# Patient Record
Sex: Male | Born: 1935 | Race: White | Hispanic: No | Marital: Married | Smoking: Former smoker
Health system: Southern US, Community
[De-identification: ages and names within clinical notes are randomized; demographics above are authoritative.]

## PROBLEM LIST (undated history)

## (undated) DIAGNOSIS — N189 Chronic kidney disease, unspecified: Secondary | ICD-10-CM

## (undated) DIAGNOSIS — H25019 Cortical age-related cataract, unspecified eye: Secondary | ICD-10-CM

## (undated) DIAGNOSIS — M5431 Sciatica, right side: Secondary | ICD-10-CM

## (undated) DIAGNOSIS — K635 Polyp of colon: Secondary | ICD-10-CM

## (undated) DIAGNOSIS — D126 Benign neoplasm of colon, unspecified: Secondary | ICD-10-CM

## (undated) DIAGNOSIS — M48061 Spinal stenosis, lumbar region without neurogenic claudication: Secondary | ICD-10-CM

## (undated) DIAGNOSIS — G629 Polyneuropathy, unspecified: Secondary | ICD-10-CM

## (undated) DIAGNOSIS — M1990 Unspecified osteoarthritis, unspecified site: Secondary | ICD-10-CM

## (undated) DIAGNOSIS — E7800 Pure hypercholesterolemia, unspecified: Secondary | ICD-10-CM

## (undated) DIAGNOSIS — I4891 Unspecified atrial fibrillation: Secondary | ICD-10-CM

---

## 2014-05-26 DIAGNOSIS — M1990 Unspecified osteoarthritis, unspecified site: Secondary | ICD-10-CM | POA: Insufficient documentation

## 2014-05-26 DIAGNOSIS — M810 Age-related osteoporosis without current pathological fracture: Secondary | ICD-10-CM | POA: Insufficient documentation

## 2014-05-26 DIAGNOSIS — E785 Hyperlipidemia, unspecified: Secondary | ICD-10-CM | POA: Insufficient documentation

## 2014-05-26 DIAGNOSIS — N183 Chronic kidney disease, stage 3 unspecified: Secondary | ICD-10-CM | POA: Insufficient documentation

## 2014-06-13 DIAGNOSIS — N529 Male erectile dysfunction, unspecified: Secondary | ICD-10-CM | POA: Insufficient documentation

## 2015-11-02 HISTORY — PX: UPPER GASTROINTESTINAL ENDOSCOPY: SHX188

## 2016-02-27 ENCOUNTER — Encounter
Admission: RE | Disposition: A | Discharge status: Home / Self Care | Payer: Self-pay | Source: Ambulatory Visit | Attending: Gastroenterology

## 2016-02-27 ENCOUNTER — Ambulatory Visit: Payer: Medicare Other | Admitting: Certified Registered Nurse Anesthetist

## 2016-02-27 ENCOUNTER — Ambulatory Visit
Admission: RE | Admit: 2016-02-27 | Discharge: 2016-02-27 | Disposition: A | Discharge status: Home / Self Care | Payer: Medicare Other | Source: Ambulatory Visit | Attending: Gastroenterology | Admitting: Gastroenterology

## 2016-02-27 ENCOUNTER — Encounter: Payer: Self-pay | Admitting: Anesthesiology

## 2016-02-27 DIAGNOSIS — Z79899 Other long term (current) drug therapy: Secondary | ICD-10-CM | POA: Insufficient documentation

## 2016-02-27 DIAGNOSIS — K5730 Diverticulosis of large intestine without perforation or abscess without bleeding: Secondary | ICD-10-CM | POA: Diagnosis not present

## 2016-02-27 DIAGNOSIS — D123 Benign neoplasm of transverse colon: Secondary | ICD-10-CM | POA: Diagnosis not present

## 2016-02-27 DIAGNOSIS — D120 Benign neoplasm of cecum: Secondary | ICD-10-CM | POA: Diagnosis not present

## 2016-02-27 DIAGNOSIS — Z86010 Personal history of colonic polyps: Secondary | ICD-10-CM | POA: Insufficient documentation

## 2016-02-27 DIAGNOSIS — K219 Gastro-esophageal reflux disease without esophagitis: Secondary | ICD-10-CM | POA: Insufficient documentation

## 2016-02-27 DIAGNOSIS — K635 Polyp of colon: Secondary | ICD-10-CM | POA: Diagnosis not present

## 2016-02-27 DIAGNOSIS — Z1211 Encounter for screening for malignant neoplasm of colon: Secondary | ICD-10-CM | POA: Insufficient documentation

## 2016-02-27 DIAGNOSIS — K640 First degree hemorrhoids: Secondary | ICD-10-CM | POA: Diagnosis not present

## 2016-02-27 DIAGNOSIS — K644 Residual hemorrhoidal skin tags: Secondary | ICD-10-CM | POA: Diagnosis not present

## 2016-02-27 HISTORY — PX: COLONOSCOPY WITH PROPOFOL: SHX5780

## 2016-02-27 SURGERY — COLONOSCOPY WITH PROPOFOL
Anesthesia: General

## 2016-02-27 MED ORDER — SODIUM CHLORIDE 0.9 % IV SOLN
INTRAVENOUS | Status: DC
  Administered 2016-02-27: 1000 mL via INTRAVENOUS

## 2016-02-27 MED ORDER — LIDOCAINE HCL (PF) 1 % IJ SOLN
2.0000 mL | Freq: Once | INTRAMUSCULAR | Status: AC
  Administered 2016-02-27: 0.03 mL via INTRADERMAL

## 2016-02-27 MED ORDER — PROPOFOL 10 MG/ML IV BOLUS
INTRAVENOUS | Status: DC | PRN
  Administered 2016-02-27: 50 mg via INTRAVENOUS

## 2016-02-27 MED ORDER — LIDOCAINE HCL (CARDIAC) 20 MG/ML IV SOLN
INTRAVENOUS | Status: DC | PRN
  Administered 2016-02-27: 60 mg via INTRAVENOUS

## 2016-02-27 MED ORDER — PROPOFOL 500 MG/50ML IV EMUL
INTRAVENOUS | Status: DC | PRN
  Administered 2016-02-27: 140 ug/kg/min via INTRAVENOUS

## 2016-02-27 MED ORDER — LIDOCAINE HCL (PF) 1 % IJ SOLN
INTRAMUSCULAR | Status: AC
  Administered 2016-02-27: 0.03 mL via INTRADERMAL
  Filled 2016-02-27: qty 2

## 2016-02-27 NOTE — Transfer of Care (Signed)
Immediate Anesthesia Transfer of Care Note  Patient: Gerald Hunter  Procedure(s) Performed: Procedure(s): COLONOSCOPY WITH PROPOFOL (N/A)  Patient Location: PACU  Anesthesia Type:General  Level of Consciousness: sedated  Airway & Oxygen Therapy: Patient Spontanous Breathing and Patient connected to nasal cannula oxygen  Post-op Assessment: Report given to RN and Post -op Vital signs reviewed and stable  Post vital signs: Reviewed and stable  Last Vitals:  Filed Vitals:   02/27/16 1311  BP: 137/93  Pulse: 104  Temp: 36.1 C  Resp: 13    Last Pain: There were no vitals filed for this visit.       Complications: No apparent anesthesia complications

## 2016-02-27 NOTE — Discharge Instructions (Signed)

## 2016-02-27 NOTE — Anesthesia Procedure Notes (Signed)
Performed by: Lizzy Hamre Pre-anesthesia Checklist: Patient identified, Emergency Drugs available, Suction available, Patient being monitored and Timeout performed Patient Re-evaluated:Patient Re-evaluated prior to inductionOxygen Delivery Method: Nasal cannula Intubation Type: IV induction       

## 2016-02-27 NOTE — Anesthesia Postprocedure Evaluation (Signed)
Anesthesia Post Note  Patient: Gerald Hunter  Procedure(s) Performed: Procedure(s) (LRB): COLONOSCOPY WITH PROPOFOL (N/A)  Patient location during evaluation: Endoscopy Anesthesia Type: General Level of consciousness: awake and alert Pain management: pain level controlled Vital Signs Assessment: post-procedure vital signs reviewed and stable Respiratory status: spontaneous breathing, nonlabored ventilation, respiratory function stable and patient connected to nasal cannula oxygen Cardiovascular status: blood pressure returned to baseline and stable Postop Assessment: no signs of nausea or vomiting Anesthetic complications: no    Last Vitals:  Filed Vitals:   02/27/16 1450 02/27/16 1500  BP: 91/73 114/80  Pulse: 84 77  Temp:    Resp: 19 13    Last Pain: There were no vitals filed for this visit.               Martha Clan

## 2016-02-27 NOTE — Op Note (Signed)
[facility] Patient Name: Gerald Hunter Procedure Date: 02/27/2016 1:52 PM MRN: [id number] Account #: [id number] Date of Birth: Aug 17, 1934 Admit Type: Outpatient Age: 82 Room: [facility] ENDO ROOM 2 Gender: Male Note Status: Finalized Procedure:            Colonoscopy Indications:          Surveillance: Personal history of adenomatous polyps on                        last colonoscopy > 5 years ago Patient Profile:      This is a 82 year old male. Providers:            Gerrit Heck. Rayann Heman, MD Referring MD:         Ocie Cornfield. Ouida Sills, MD (Referring MD) Medicines:            Propofol per Anesthesia Complications:        No immediate complications. Procedure:            Pre-Anesthesia Assessment:                       - Prior to the procedure, a History and Physical was                        performed, and patient medications, allergies and                        sensitivities were reviewed. The patient's tolerance of                        previous anesthesia was reviewed.                       After obtaining informed consent, the colonoscope was                        passed under direct vision. Throughout the procedure,                        the patient's blood pressure, pulse, and oxygen                        saturations were monitored continuously. The                        Colonoscope was introduced through the anus and                        advanced to the the cecum, identified by appendiceal                        orifice and ileocecal valve. The colonoscopy was                        performed without difficulty. The patient tolerated the                        procedure well. The quality of the bowel preparation                        was good. Findings:  The perianal exam findings include non-thrombosed external hemorrhoids.      A 15 mm polyp was found in the cecum. The polyp was flat. The polyp was       removed with a saline  injection-lift technique using a hot snare.       Resection and retrieval were complete. To prevent bleeding after the       polypectomy, two hemostatic clips were successfully placed (MR       conditional). There was no bleeding at the end of the procedure.      Four sessile polyps were found in the transverse colon. The polyps were       3 to 6 mm in size. These polyps were removed with a cold snare.       Resection and retrieval were complete.      Many small and large-mouthed diverticula were found in the sigmoid colon.      Internal hemorrhoids were found during retroflexion. The hemorrhoids       were Grade I (internal hemorrhoids that do not prolapse).      The exam was otherwise without abnormality. Impression:           - Non-thrombosed external hemorrhoids found on perianal                        exam.                       - One 15 mm polyp in the cecum, removed using                        injection-lift and a hot snare. Resected and retrieved.                        Clips (MR conditional) were placed.                       - Four 3 to 6 mm polyps in the transverse colon,                        removed with a cold snare. Resected and retrieved.                       - Diverticulosis in the sigmoid colon.                       - Internal hemorrhoids.                       - The examination was otherwise normal. Recommendation:       - Observe patient in GI recovery unit.                       - High fiber diet.                       - Continue present medications.                       - Await pathology results.                       - Return to referring physician.                       -  Would no perfrom further colon cncer screening based                        on age.                       - The findings and recommendations were discussed with                        the patient.                       - The findings and recommendations were discussed with                         the patient's family. Procedure Code(s):    --- Professional ---                       [phone], Colonoscopy, flexible; with removal of tumor(s),                        polyp(s), or other lesion(s) by snare technique                       [id number], Colonoscopy, flexible; with directed submucosal                        injection(s), any substance Diagnosis Code(s):    --- Professional ---                       Z86.010, Personal history of colonic polyps                       D12.0, Benign neoplasm of cecum                       D12.3, Benign neoplasm of transverse colon (hepatic                        flexure or splenic flexure)                       K64.0, First degree hemorrhoids                       K64.4, Residual hemorrhoidal skin tags                       K57.30, Diverticulosis of large intestine without                        perforation or abscess without bleeding CPT copyright 2016 [employer]. All rights reserved. The codes documented in this report are preliminary and upon coder review may  be revised to meet current compliance requirements. Mellody Life, MD 02/27/2016 2:28:10 PM This report has been signed electronically. Number of Addenda: 0 Note Initiated On: 02/27/2016 1:52 PM Scope Withdrawal Time: 0 hours 19 minutes 1 second  Total Procedure Duration: 0 hours 27 minutes 18 seconds       [facility]

## 2016-02-27 NOTE — Anesthesia Preprocedure Evaluation (Signed)
Anesthesia Evaluation  Patient identified by MRN, date of birth, ID band Patient awake    Reviewed: Allergy & Precautions, H&P , NPO status , Patient's Chart, lab work & pertinent test results, reviewed documented beta blocker date and time   History of Anesthesia Complications Negative for: history of anesthetic complications  Airway Mallampati: II  TM Distance: >3 FB Neck ROM: full    Dental no notable dental hx. (+) Caps, Missing   Pulmonary neg pulmonary ROS,    Pulmonary exam normal breath sounds clear to auscultation       Cardiovascular Exercise Tolerance: Good negative cardio ROS Normal cardiovascular exam Rhythm:regular Rate:Normal     Neuro/Psych negative neurological ROS  negative psych ROS   GI/Hepatic Neg liver ROS, GERD  ,  Endo/Other  negative endocrine ROS  Renal/GU negative Renal ROS  negative genitourinary   Musculoskeletal   Abdominal   Peds  Hematology negative hematology ROS (+)   Anesthesia Other Findings History reviewed. No pertinent past medical history.   Reproductive/Obstetrics negative OB ROS                             Anesthesia Physical Anesthesia Plan  ASA: II  Anesthesia Plan: General   Post-op Pain Management:    Induction:   Airway Management Planned:   Additional Equipment:   Intra-op Plan:   Post-operative Plan:   Informed Consent: I have reviewed the patients History and Physical, chart, labs and discussed the procedure including the risks, benefits and alternatives for the proposed anesthesia with the patient or authorized representative who has indicated his/her understanding and acceptance.   Dental Advisory Given  Plan Discussed with: Anesthesiologist, CRNA and Surgeon  Anesthesia Plan Comments:         Anesthesia Quick Evaluation

## 2016-02-27 NOTE — H&P (Signed)
  Primary Care Physician:  Kirk Ruths., MD  Pre-Procedure History & Physical: HPI:  Gerald Hunter is a 82 y.o. male is here for an colonoscopy.   History reviewed. No pertinent past medical history.  History reviewed. No pertinent past surgical history.  Prior to Admission medications   Medication Sig Start Date End Date Taking? Authorizing Provider  atorvastatin (LIPITOR) 10 MG tablet Take 10 mg by mouth daily.   Yes Historical Provider, MD  sildenafil (REVATIO) 20 MG tablet Take 20 mg by mouth daily as needed.   Yes Historical Provider, MD    Allergies as of 02/18/2016  . (Not on File)    History reviewed. No pertinent family history.  Social History   Social History  . Marital Status: Married    Spouse Name: N/A  . Number of Children: N/A  . Years of Education: N/A   Occupational History  . Not on file.   Social History Main Topics  . Smoking status: Not on file  . Smokeless tobacco: Not on file  . Alcohol Use: Not on file  . Drug Use: Not on file  . Sexual Activity: Not on file   Other Topics Concern  . Not on file   Social History Narrative  . No narrative on file     Physical Exam: BP 137/93 mmHg  Pulse 104  Temp(Src) 97 F (36.1 C) (Tympanic)  Resp 13  Ht 5' 7.5" (1.715 m)  Wt 83.915 kg (185 lb)  BMI 28.53 kg/m2  SpO2 99% General:   Alert,  pleasant and cooperative in NAD Head:  Normocephalic and atraumatic. Neck:  Supple; no masses or thyromegaly. Lungs:  Clear throughout to auscultation.    Heart:  Regular rate and rhythm. Abdomen:  Soft, nontender and nondistended. Normal bowel sounds, without guarding, and without rebound.   Neurologic:  Alert and  oriented x4;  grossly normal neurologically.  Impression/Plan: Gerald Hunter is here for an colonoscopy to be performed for pers Hx TA  Risks, benefits, limitations, and alternatives regarding  colonoscopy have been reviewed with the patient.  Questions have been answered.  All parties  agreeable.   Josefine Class, MD  02/27/2016, 1:45 PM

## 2016-03-01 ENCOUNTER — Encounter: Payer: Self-pay | Admitting: Gastroenterology

## 2016-03-02 LAB — SURGICAL PATHOLOGY

## 2018-12-13 ENCOUNTER — Emergency Department
Admission: EM | Admit: 2018-12-13 | Discharge: 2018-12-13 | Disposition: A | Discharge status: Home / Self Care | Payer: Medicare Other | Attending: Emergency Medicine | Admitting: Emergency Medicine

## 2018-12-13 ENCOUNTER — Emergency Department: Payer: Medicare Other

## 2018-12-13 ENCOUNTER — Encounter: Payer: Self-pay | Admitting: Emergency Medicine

## 2018-12-13 ENCOUNTER — Other Ambulatory Visit: Payer: Self-pay

## 2018-12-13 DIAGNOSIS — R079 Chest pain, unspecified: Secondary | ICD-10-CM | POA: Diagnosis present

## 2018-12-13 DIAGNOSIS — K8020 Calculus of gallbladder without cholecystitis without obstruction: Secondary | ICD-10-CM | POA: Insufficient documentation

## 2018-12-13 DIAGNOSIS — Z79899 Other long term (current) drug therapy: Secondary | ICD-10-CM | POA: Insufficient documentation

## 2018-12-13 HISTORY — DX: Pure hypercholesterolemia, unspecified: E78.00

## 2018-12-13 LAB — COMPREHENSIVE METABOLIC PANEL
ALBUMIN: 4.5 g/dL (ref 3.5–5.0)
ALK PHOS: 68 U/L (ref 38–126)
ALT: 39 U/L (ref 0–44)
AST: 66 U/L — AB (ref 15–41)
Anion gap: 8 (ref 5–15)
BILIRUBIN TOTAL: 1.3 mg/dL — AB (ref 0.3–1.2)
BUN: 23 mg/dL (ref 8–23)
CALCIUM: 9 mg/dL (ref 8.9–10.3)
CO2: 28 mmol/L (ref 22–32)
Chloride: 105 mmol/L (ref 98–111)
Creatinine, Ser: 1.29 mg/dL — ABNORMAL HIGH (ref 0.61–1.24)
GFR calc Af Amer: 59 mL/min — ABNORMAL LOW (ref >60)
GFR, EST NON AFRICAN AMERICAN: 51 mL/min — AB (ref >60)
GLUCOSE: 113 mg/dL — AB (ref 70–99)
Potassium: 3.4 mmol/L — ABNORMAL LOW (ref 3.5–5.1)
Sodium: 141 mmol/L (ref 135–145)
TOTAL PROTEIN: 7.2 g/dL (ref 6.5–8.1)

## 2018-12-13 LAB — CBC WITH DIFFERENTIAL/PLATELET
Abs Immature Granulocytes: 0.02 10*3/uL (ref 0.00–0.07)
BASOS ABS: 0.1 10*3/uL (ref 0.0–0.1)
Basophils Relative: 1 %
EOS ABS: 0.3 10*3/uL (ref 0.0–0.5)
EOS PCT: 4 %
HEMATOCRIT: 48.1 % (ref 39.0–52.0)
HEMOGLOBIN: 16.1 g/dL (ref 13.0–17.0)
IMMATURE GRANULOCYTES: 0 %
LYMPHS ABS: 2.8 10*3/uL (ref 0.7–4.0)
LYMPHS PCT: 33 %
MCH: 30.9 pg (ref 26.0–34.0)
MCHC: 33.5 g/dL (ref 30.0–36.0)
MCV: 92.3 fL (ref 80.0–100.0)
MONOS PCT: 9 %
Monocytes Absolute: 0.7 10*3/uL (ref 0.1–1.0)
NRBC: 0 % (ref 0.0–0.2)
Neutro Abs: 4.5 10*3/uL (ref 1.7–7.7)
Neutrophils Relative %: 53 %
Platelets: 228 10*3/uL (ref 150–400)
RBC: 5.21 MIL/uL (ref 4.22–5.81)
RDW: 13.3 % (ref 11.5–15.5)
WBC: 8.4 10*3/uL (ref 4.0–10.5)

## 2018-12-13 LAB — TROPONIN I: Troponin I: <0.03 ng/mL (ref <0.03)

## 2018-12-13 LAB — LIPASE, BLOOD: Lipase: 35 U/L (ref 11–51)

## 2018-12-13 MED ORDER — FAMOTIDINE IN NACL 20-0.9 MG/50ML-% IV SOLN
20.0000 mg | Freq: Once | INTRAVENOUS | Status: AC
  Administered 2018-12-13: 20 mg via INTRAVENOUS
  Filled 2018-12-13: qty 50

## 2018-12-13 MED ORDER — FENTANYL CITRATE (PF) 100 MCG/2ML IJ SOLN
50.0000 ug | Freq: Once | INTRAMUSCULAR | Status: AC
  Administered 2018-12-13: 50 ug via INTRAVENOUS
  Filled 2018-12-13: qty 2

## 2018-12-13 NOTE — ED Notes (Signed)
Unable to obtain discharge signature. Electronic signature pad not working at this time.

## 2018-12-13 NOTE — ED Notes (Signed)
Patient c/o having acid reflux and requested medication. Dr. Beather Arbour aware, Pepcid given as ordered.

## 2018-12-13 NOTE — ED Provider Notes (Signed)
[facility] Emergency Department Provider Note   ____________________________________________   First MD Initiated Contact with Patient 12/13/18 0502     (approximate)  I have reviewed the triage vital signs and the nursing notes.   HISTORY  Chief Complaint Chest Pain    HPI Gerald Hunter is a 85 y.o. male who presents to the ED from home with a chief complaint of chest pain.  Patient reports awakening at 1:30 AM with substernal chest/epigastric pain which he describes as a dull ache.  Denies associated diaphoresis, nausea/vomiting, palpitations or dizziness.  At the maximum pain was 4/10 at home.  Patient took 3 full-strength aspirins prior to arrival.  Also took a reflux pill while in the ED lobby.  Pain is currently resolved.  Tells me he takes multiple aspirins daily.  Denies black or bloody stools.  Denies recent fever, chills, shortness of breath, abdominal pain, dysuria or diarrhea.  Denies recent travel or trauma.    Past Medical History:  Diagnosis Date  . Hypercholesteremia     There are no active problems to display for this patient.   Past Surgical History:  Procedure Laterality Date  . COLONOSCOPY WITH PROPOFOL N/A 02/27/2016   Procedure: COLONOSCOPY WITH PROPOFOL;  Surgeon: Josefine Class, MD;  Location: [facility] ENDOSCOPY;  Service: Endoscopy;  Laterality: N/A;    Prior to Admission medications   Medication Sig Start Date End Date Taking? Authorizing Provider  atorvastatin (LIPITOR) 10 MG tablet Take 10 mg by mouth daily.    [provider]  sildenafil (REVATIO) 20 MG tablet Take 20 mg by mouth daily as needed.    [provider]    Allergies Patient has no known allergies.  No family history on file.  Social History Social History   Tobacco Use  . Smoking status: Never Smoker  . Smokeless tobacco: Never Used  Substance Use Topics  . Alcohol use: Not on file  . Drug use: Not on file    Review of  Systems  Constitutional: No fever/chills Eyes: No visual changes. ENT: No sore throat. Cardiovascular: Positive for chest pain. Respiratory: Denies shortness of breath. Gastrointestinal: No abdominal pain.  No nausea, no vomiting.  No diarrhea.  No constipation. Genitourinary: Negative for dysuria. Musculoskeletal: Negative for back pain. Skin: Negative for rash. Neurological: Negative for headaches, focal weakness or numbness.   ____________________________________________   PHYSICAL EXAM:  VITAL SIGNS: ED Triage Vitals  Enc Vitals Group     BP 12/13/18 0222 (!) 158/87     Pulse Rate 12/13/18 0222 75     Resp 12/13/18 0222 18     Temp 12/13/18 0222 97.9 F (36.6 C)     Temp Source 12/13/18 0222 Oral     SpO2 12/13/18 0222 97 %     Weight 12/13/18 0218 190 lb (86.2 kg)     Height 12/13/18 0218 5\' 7"  (1.702 m)     Head Circumference --      Peak Flow --      Pain Score 12/13/18 0218 4     Pain Loc --      Pain Edu? --      Excl. in [place]? --     Constitutional: Alert and oriented. Well appearing and in no acute distress. Eyes: Conjunctivae are normal. PERRL. EOMI. Head: Atraumatic. Nose: No congestion/rhinnorhea. Mouth/Throat: Mucous membranes are moist.  Oropharynx non-erythematous. Neck: No stridor.   Cardiovascular: Normal rate, regular rhythm. Grossly normal heart sounds.  Good peripheral circulation. Respiratory:  Normal respiratory effort.  No retractions. Lungs CTAB. Gastrointestinal: Soft and nontender to light or deep palpation. No distention. No abdominal bruits. No CVA tenderness. Musculoskeletal: No lower extremity tenderness nor edema.  No joint effusions. Neurologic:  Normal speech and language. No gross focal neurologic deficits are appreciated. No gait instability. Skin:  Skin is warm, dry and intact. No rash noted. Psychiatric: Mood and affect are normal. Speech and behavior are normal.  ____________________________________________   LABS (all labs  ordered are listed, but only abnormal results are displayed)  Labs Reviewed  COMPREHENSIVE METABOLIC PANEL - Abnormal; Notable for the following components:      Result Value   Potassium 3.4 (*)    Glucose, Bld 113 (*)    Creatinine, Ser 1.29 (*)    AST 66 (*)    Total Bilirubin 1.3 (*)    GFR calc non Af Amer 51 (*)    GFR calc Af Amer 59 (*)    All other components within normal limits  CBC WITH DIFFERENTIAL/PLATELET  TROPONIN I  LIPASE, BLOOD  TROPONIN I   ____________________________________________  EKG  ED ECG REPORT I, Rondal Vandevelde J, the attending physician, personally viewed and interpreted this ECG.   Date: 12/13/2018  EKG Time: 0218  Rate: 73  Rhythm: normal EKG, normal sinus rhythm  Axis: Normal  Intervals:none  ST&T Change: Nonspecific  ____________________________________________  RADIOLOGY  ED MD interpretation: No acute cardiopulmonary process  Official radiology report(s): Dg Chest 2 View  Result Date: 12/13/2018 CLINICAL DATA:  Chest pain EXAM: CHEST - 2 VIEW COMPARISON:  None. FINDINGS: The heart size and mediastinal contours are within normal limits. Both lungs are clear. The visualized skeletal structures are unremarkable. IMPRESSION: No active cardiopulmonary disease. Electronically Signed   By: Inez Catalina M.D.   On: 12/13/2018 02:37    ____________________________________________   PROCEDURES  Procedure(s) performed: None  Procedures  Critical Care performed: No  ____________________________________________   INITIAL IMPRESSION / ASSESSMENT AND PLAN / ED COURSE  As part of my medical decision making, I reviewed the following data within the electronic MEDICAL RECORD NUMBER History obtained from family, Nursing notes reviewed and incorporated, Labs reviewed, EKG interpreted, Old chart reviewed, Radiograph reviewed and Notes from prior ED visits    85 year old male who presents with substernal chest pain. Differential diagnosis includes,  but is not limited to, ACS, aortic dissection, pulmonary embolism, cardiac tamponade, pneumothorax, pneumonia, pericarditis, myocarditis, GI-related causes including esophagitis/gastritis, and musculoskeletal chest wall pain.    Initial troponin and EKG are unremarkable.  Will check lipase, repeat timed troponin.  Administer IV Pepcid for discomfort.  I discussed with patient and offered admission for chest pain rule out.  He is unconvinced and desires to go home.  We agree with repeat timed troponin and checking lipase.   Clinical Course as of Dec 13 657  Wed Dec 13, 2018  0607 No relief with IV Pepcid.  Still complaining of 2/10 substernal chest/epigastric discomfort/aching.  Updated patient and spouse of repeat negative troponin and normal lipase.  Patient is still not interested in coming into the hospital.  Will send for right upper quadrant abdominal ultrasound.  If that is negative, anticipate discharge home with cardiology follow-up.   [JS]  [id number] Patient currently in ultrasound.  Spoke with spouse; if ultrasound is negative, and patient still desires discharge home, he may be discharged home with cardiology follow-up.  At this time care is transferred to Dr. Quentin Cornwall at shift change.   [JS]  Clinical Course User Index [JS] Paulette Blanch, MD     ____________________________________________   FINAL CLINICAL IMPRESSION(S) / ED DIAGNOSES  Final diagnoses:  Nonspecific chest pain     ED Discharge Orders    None       Note:  This document was prepared using Dragon voice recognition software and may include unintentional dictation errors.   Paulette Blanch, MD 12/13/18 [phone]

## 2018-12-13 NOTE — ED Triage Notes (Signed)
Pt to triage via w/c with no distress noted; pt reports upper CP, nonradiating since 130am; denies any accomp symptoms; denies hx of same

## 2018-12-13 NOTE — ED Notes (Signed)
Patient denies any cardiac history. Reports being woken up at approx 0130 by chest pain to both sides of chest "just below nipple line" with a few minutes of diaphoresis. Patient states it has been many years since last stress test, but that he was told his heart was in very good shape at previous check up. Patient denies any history of symptoms previously that are similar to current symptoms. Patient reports taking 324 ASA x3 tablets at home prior to arrival.

## 2018-12-13 NOTE — ED Notes (Signed)
Patient transported to Ultrasound 

## 2018-12-13 NOTE — Discharge Instructions (Addendum)

## 2018-12-13 NOTE — ED Notes (Signed)
Unable to reassess pain after medication administration at this time d/t patient still being in ultrasound.

## 2018-12-13 NOTE — ED Provider Notes (Signed)
Patient received in signout from Dr. Beather Arbour.  Ultrasound shows evidence of cholelithiasis we discussed results of the ultrasound including noted renal cyst and liver abnormalities.  Blood work is otherwise reassuring. there is no evidence of significant biliary obstruction by labs and no white count to suggest infectious process.  His pain is completely gone at this time.  His abdominal exam is benign.  Patient requesting discharge home.  Will be given referral for outpatient surgery as well.   Merlyn Lot, MD 12/13/18 [phone]

## 2018-12-26 ENCOUNTER — Ambulatory Visit (INDEPENDENT_AMBULATORY_CARE_PROVIDER_SITE_OTHER): Payer: Medicare Other | Admitting: Surgery

## 2018-12-26 ENCOUNTER — Encounter: Payer: Self-pay | Admitting: Surgery

## 2018-12-26 ENCOUNTER — Other Ambulatory Visit: Payer: Self-pay

## 2018-12-26 VITALS — BP 132/74 | HR 72 | Temp 97.9°F | Ht 67.0 in | Wt 194.0 lb

## 2018-12-26 DIAGNOSIS — K219 Gastro-esophageal reflux disease without esophagitis: Secondary | ICD-10-CM

## 2018-12-26 DIAGNOSIS — Z7982 Long term (current) use of aspirin: Secondary | ICD-10-CM

## 2018-12-26 DIAGNOSIS — K8020 Calculus of gallbladder without cholecystitis without obstruction: Secondary | ICD-10-CM | POA: Diagnosis not present

## 2018-12-26 NOTE — Patient Instructions (Signed)
Patient to return as needed. The patient is aware to call back for any questions or concerns. 

## 2018-12-26 NOTE — Progress Notes (Signed)
Surgical Clinic History and Physical  Referring provider:  Kirk Ruths, MD [place], [place] [address]  HISTORY OF PRESENT ILLNESS (HPI):  85 y.o. male presents for evaluation of his recent severe RUQ and epigastric abdominal pain. Patient reports his pain appears to have started after he ate a more fatty than usual chicken microwave dinner and a drink of scotch liquor, though his wife states he also frequently likes eating steak. He says that his pain was not relieved with the GI cocktail administered in the ED, but states only Zantac (recalled and no longer available), taken once - twice per week as needed, has ever provided relief for his GERD. Pepcid specifically has not helped. His pain in the ED resolved after given narcotic pain medication. He takes full-strength aspirin 325 mg x 4 tablets daily x many years for chronic back pain and recently bought a "small bottle" of "generic Nexium" (says not Prilosec/omeprazole) to try. He otherwise denies dysphasia, frequent belching, any persistent abdominal pain, blood with BM's, melena, N/V, fever/chills, unintentional weight loss, CP, or SOB.  PAST MEDICAL HISTORY (PMH):  Past Medical History:  Diagnosis Date  . Hypercholesteremia     PAST SURGICAL HISTORY ([place]):  Past Surgical History:  Procedure Laterality Date  . COLONOSCOPY WITH PROPOFOL N/A 02/27/2016   Procedure: COLONOSCOPY WITH PROPOFOL;  Surgeon: Josefine Class, MD;  Location: [facility] ENDOSCOPY;  Service: Endoscopy;  Laterality: N/A;    MEDICATIONS:  Prior to Admission medications   Medication Sig Start Date End Date Taking? Authorizing Provider  aspirin 325 MG tablet Take 325 mg by mouth every 6 (six) hours as needed.   Yes [provider]  atorvastatin (LIPITOR) 10 MG tablet Take 10 mg by mouth daily.   Yes [provider]  sildenafil (REVATIO) 20 MG tablet Take 20 mg by mouth daily as needed.   Yes [provider]    ALLERGIES:  No Known Allergies   SOCIAL HISTORY:  Social History   Socioeconomic History  . Marital status: Married    Spouse name: Not on file  . Number of children: Not on file  . Years of education: Not on file  . Highest education level: Not on file  Occupational History  . Not on file  Social Needs  . Financial resource strain: Not on file  . Food insecurity:    Worry: Not on file    Inability: Not on file  . Transportation needs:    Medical: Not on file    Non-medical: Not on file  Tobacco Use  . Smoking status: Never Smoker  . Smokeless tobacco: Never Used  Substance and Sexual Activity  . Alcohol use: Not on file  . Drug use: Not on file  . Sexual activity: Not on file  Lifestyle  . Physical activity:    Days per week: Not on file    Minutes per session: Not on file  . Stress: Not on file  Relationships  . Social connections:    Talks on phone: Not on file    Gets together: Not on file    Attends religious service: Not on file    Active member of club or organization: Not on file    Attends meetings of clubs or organizations: Not on file    Relationship status: Not on file  . Intimate partner violence:    Fear of current or ex partner: Not on file    Emotionally abused:  Not on file    Physically abused: Not on file    Forced sexual activity: Not on file  Other Topics Concern  . Not on file  Social History Narrative  . Not on file    The patient currently resides (home / rehab facility / nursing home): Home The patient normally is (ambulatory / bedbound): Ambulatory  FAMILY HISTORY:  History reviewed. No pertinent family history.  Otherwise negative/non-contributory.  REVIEW OF SYSTEMS:  Constitutional: denies any other weight loss, fever, chills, or sweats  Eyes: denies any other vision changes, history of eye injury  ENT: denies sore throat, hearing problems  Respiratory: denies shortness of breath, wheezing  Cardiovascular:  denies chest pain, palpitations  Gastrointestinal: abdominal pain, N/V, and bowel function as per HPI Musculoskeletal: denies any other joint pains or cramps  Skin: Denies any other rashes or skin discolorations Neurological: denies any other headache, dizziness, weakness  Psychiatric: Denies any other depression, anxiety   All other review of systems were otherwise negative   VITAL SIGNS:  BP 132/74   Pulse 72   Temp 97.9 F (36.6 C) (Skin)   Ht 5\' 7"  (1.702 m)   Wt 194 lb (88 kg)   SpO2 98%   BMI 30.38 kg/m    PHYSICAL EXAM:  Constitutional:  -- Normal body habitus  -- Awake, alert, and oriented x3  Eyes:  -- Pupils equally round and reactive to light  -- No scleral icterus  Ear, nose, throat:  -- No jugular venous distension -- No nasal drainage, bleeding Pulmonary:  -- No crackles  -- Equal breath sounds bilaterally -- Breathing non-labored at rest Cardiovascular:  -- S1, S2 present  -- No pericardial rubs  Gastrointestinal:  -- Abdomen soft, non-tender to palpation, non-distended, no guarding/rebound tenderness -- No abdominal masses appreciated, pulsatile or otherwise  Musculoskeletal and Integumentary:  -- Wounds or skin discoloration: None appreciated -- Extremities: B/L UE and LE FROM, hands and feet warm, no edema  Neurologic:  -- Motor function: Intact and symmetric -- Sensation: Intact and symmetric  Labs:  CBC Latest Ref Rng & Units 12/13/2018  WBC 4.0 - 10.5 K/uL 8.4  Hemoglobin 13.0 - 17.0 g/dL 16.1  Hematocrit 39.0 - 52.0 % 48.1  Platelets 150 - 400 K/uL 228   CMP Latest Ref Rng & Units 12/13/2018  Glucose 70 - 99 mg/dL 113(H)  BUN 8 - 23 mg/dL 23  Creatinine 0.61 - 1.24 mg/dL 1.29(H)  Sodium 135 - 145 mmol/L 141  Potassium 3.5 - 5.1 mmol/L 3.4(L)  Chloride 98 - 111 mmol/L 105  CO2 22 - 32 mmol/L 28  Calcium 8.9 - 10.3 mg/dL 9.0  Total Protein 6.5 - 8.1 g/dL 7.2  Total Bilirubin 0.3 - 1.2 mg/dL 1.3(H)  Alkaline Phos 38 - 126 U/L 68  AST  15 - 41 U/L 66(H)  ALT 0 - 44 U/L 39   Imaging studies:  Limited RUQ Abdominal Ultrasound (12/13/2018) 1. Cholelithiasis with gallstones present in the neck of the gallbladder. No gallbladder wall thickening or pericholecystic fluid.  2. Prominent common hepatic and proximal common bile duct. No biliary duct mass or calculus evident. Note, however, that portions common bile duct are obscured by gas.  3. Dominant mildly complex cyst in the right lobe of the liver. Small cysts in left lobe of liver.  4. Bilobed cyst arising from lower pole right kidney. Renal cortical thinning on the right may be a function of age but also may indicate a degree of underlying  medical renal disease. Right renal echogenicity is normal.  Assessment/Plan: 85 y.o. male with recent RUQ and epigastric abdominal pain, likely attributable to combination of symptomatic cholelithiasis and/or GERD with frequent daily full-dose aspirin (325 mg x 4 daily), complicated by pertinent comorbidities including advanced chronological age and hypercholesterolemia.   - discussed pain attributable to symptomatic cholelithiasis and GERD/PUD  - patient strongly and repeatedly encouraged not to continue taking aspirin 325 mg at least x4 daily over extended period             - avoid/minimize foods with higher fat content (meats, cheeses/dairy, and fried)             - prefer low-fat vegetables, whole grains (wheat bread, ceareals, etc), and fruits              - all risks, benefits, and alternatives to cholecystectomy were discussed with the patient, all of his questions were answered to patient's expressed satisfaction, and though patient's wife encourages him to proceed as she experienced relief of similar symptoms following cholecystectomy, patient expresses he would rather avoid surgery and will call if he changes his mind.             - GI referral also offered in context of poorly controlled GERD with chronic aspirin overuse,  but patient declines  - patient agrees to request spine referral from his PMD to discuss treatment options for chronic back pain other than frequent daily aspirin             - return to clinic as needed, instructed to call if any questions or concerns  All of the above recommendations were discussed with the patient and patient's wife, and all of patient's and family's questions were answered to their expressed satisfaction.  Thank you for the opportunity to participate in this patient's care.  -- Marilynne Drivers Rosana Hoes, MD, [place]: [facility] - Partnering for exceptional care. Office: [phone]

## 2020-02-07 DIAGNOSIS — I480 Paroxysmal atrial fibrillation: Secondary | ICD-10-CM | POA: Insufficient documentation

## 2020-02-09 IMAGING — CR DG CHEST 2V
1 series · 2 of 2 positions shown · non-contrast
Comparison: None.

CLINICAL DATA: Chest pain

EXAM:
CHEST - 2 VIEW

[Series 1: dg chest 2 view · 0.14mm/px · 2 of 2 slices shown]
[im 1/2]
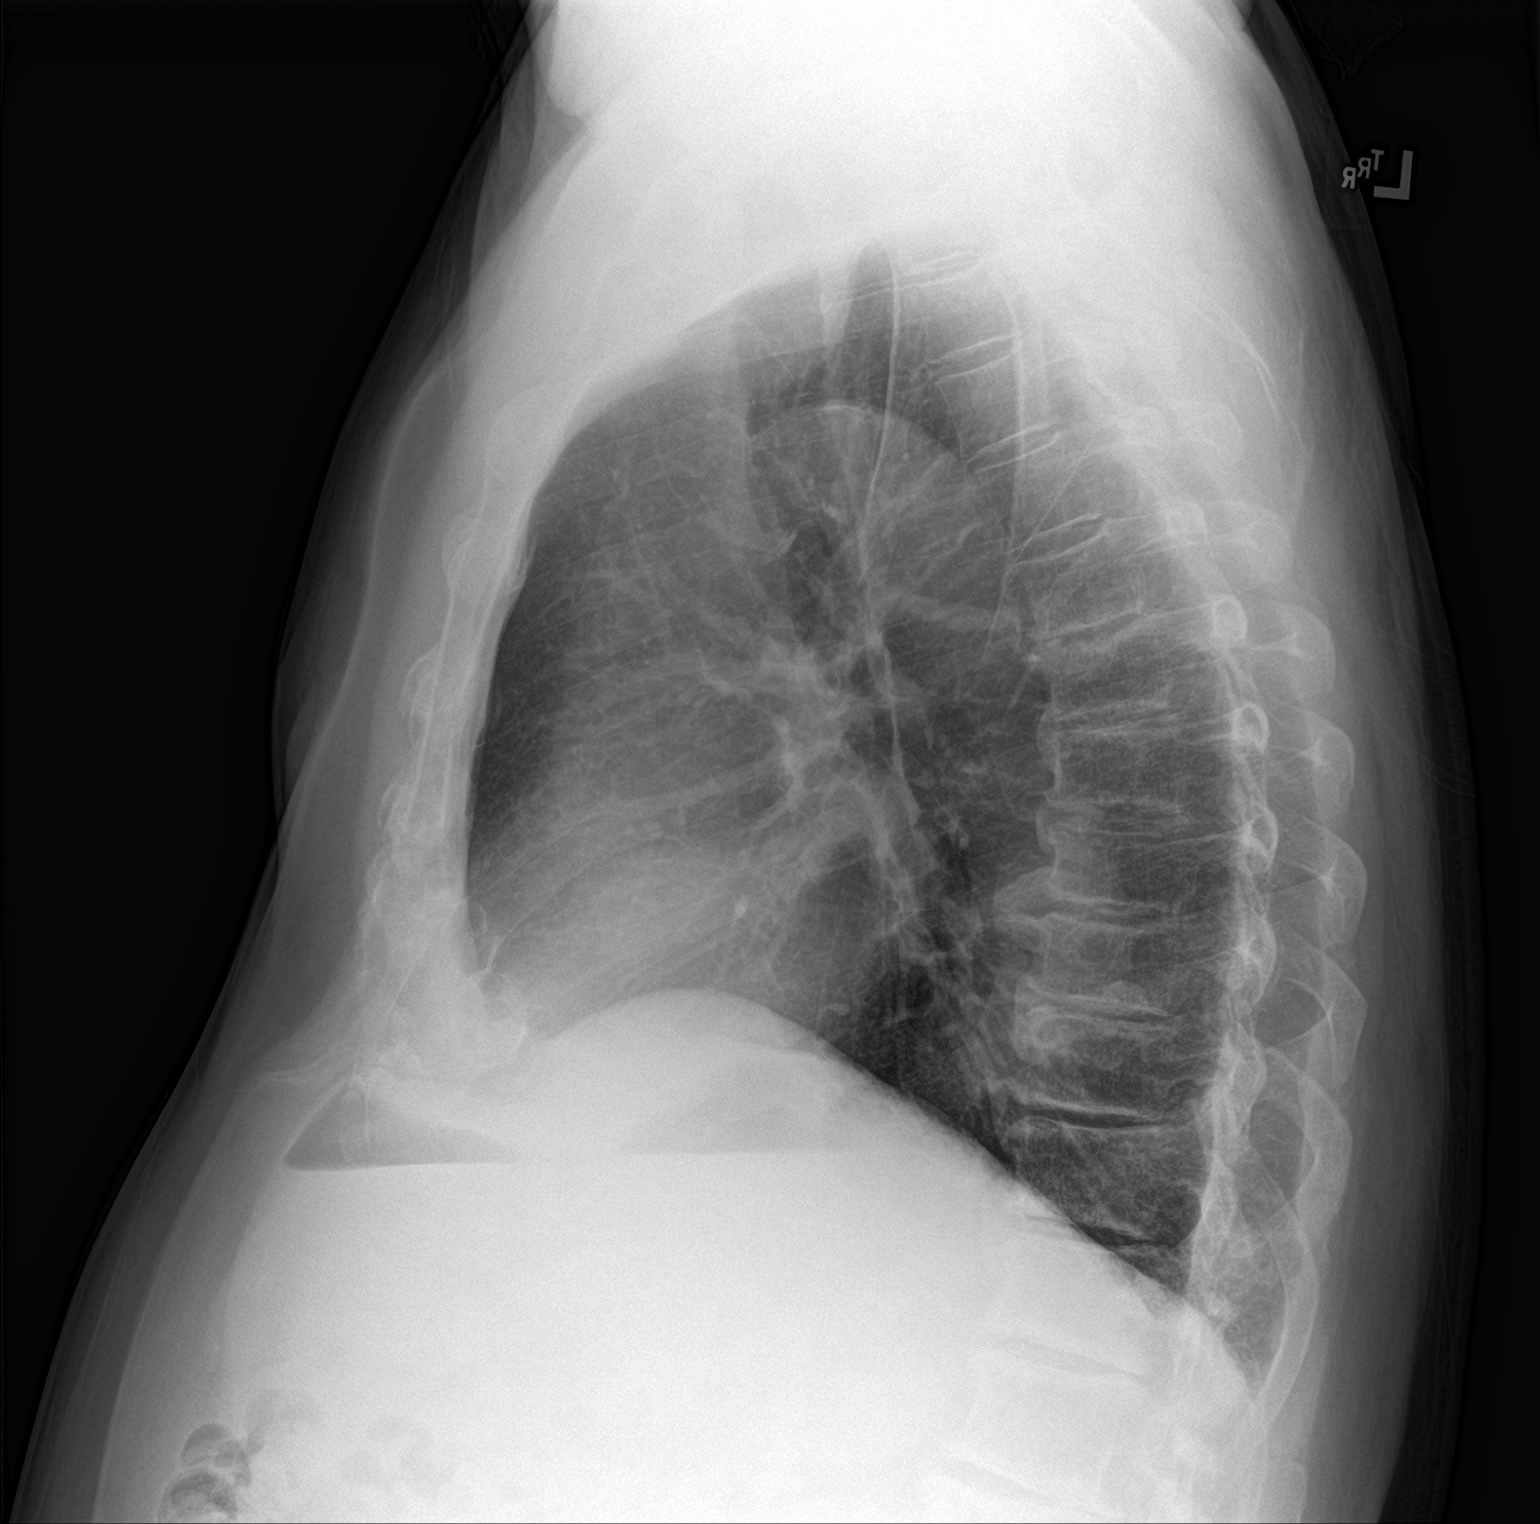
[im 2/2]
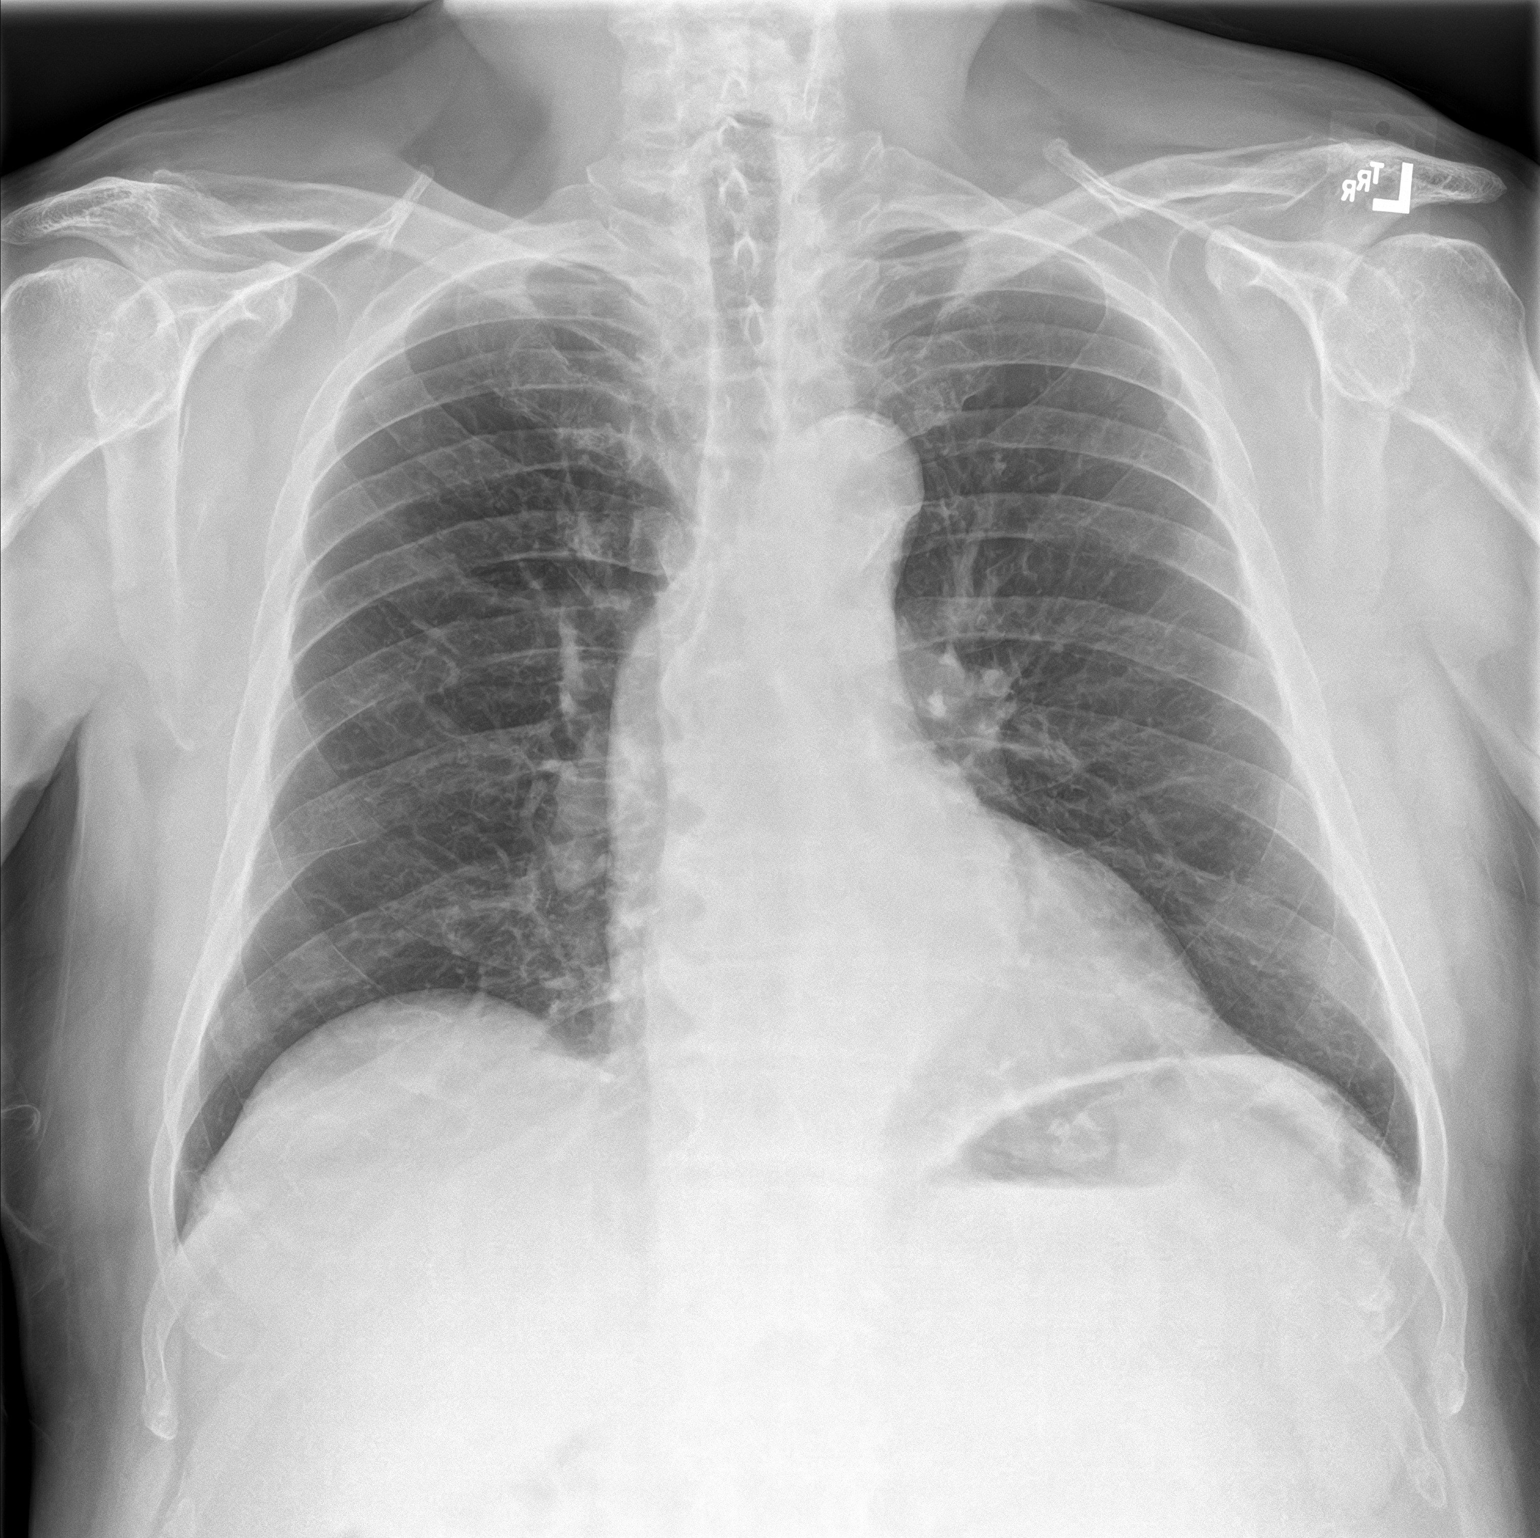

[2 of 2 positions shown; findings below may reference images not displayed]

FINDINGS: The heart size and mediastinal contours are within normal limits.
Both lungs are clear. The visualized skeletal structures are
unremarkable.
IMPRESSION: No active cardiopulmonary disease.

## 2020-08-29 ENCOUNTER — Other Ambulatory Visit (HOSPITAL_COMMUNITY): Payer: Self-pay | Admitting: Nurse Practitioner

## 2020-08-29 ENCOUNTER — Other Ambulatory Visit: Payer: Self-pay | Admitting: Nurse Practitioner

## 2020-08-29 DIAGNOSIS — M5416 Radiculopathy, lumbar region: Secondary | ICD-10-CM

## 2020-09-01 ENCOUNTER — Other Ambulatory Visit: Payer: Self-pay

## 2020-09-01 ENCOUNTER — Ambulatory Visit
Admission: RE | Admit: 2020-09-01 | Discharge: 2020-09-01 | Disposition: A | Discharge status: Home / Self Care | Payer: Medicare Other | Source: Ambulatory Visit | Attending: Nurse Practitioner | Admitting: Nurse Practitioner

## 2020-09-01 DIAGNOSIS — M5416 Radiculopathy, lumbar region: Secondary | ICD-10-CM

## 2020-10-14 DIAGNOSIS — G8929 Other chronic pain: Secondary | ICD-10-CM | POA: Insufficient documentation

## 2020-10-14 DIAGNOSIS — M48061 Spinal stenosis, lumbar region without neurogenic claudication: Secondary | ICD-10-CM | POA: Insufficient documentation

## 2020-12-30 IMAGING — US US ABDOMEN LIMITED
1 series · 13 of 25 positions shown · non-contrast
Comparison: None.

CLINICAL DATA: Epigastric region pain

EXAM:
ULTRASOUND ABDOMEN LIMITED RIGHT UPPER QUADRANT

[Series 1: us abdomen limited · 0.25mm/px · 13 of 56 slices shown]
[im 1/56]
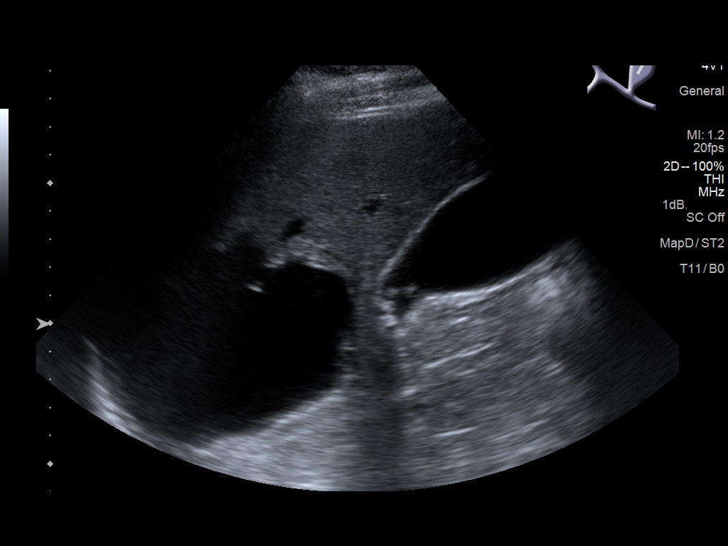
[im 5/56]
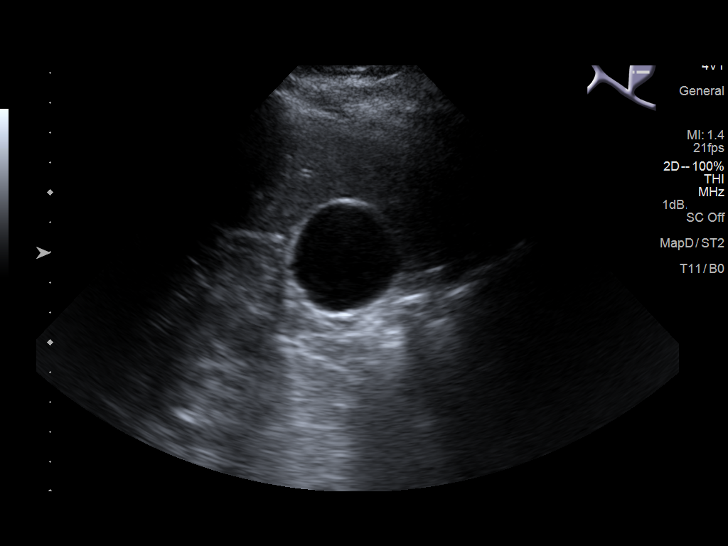
[im 10/56]
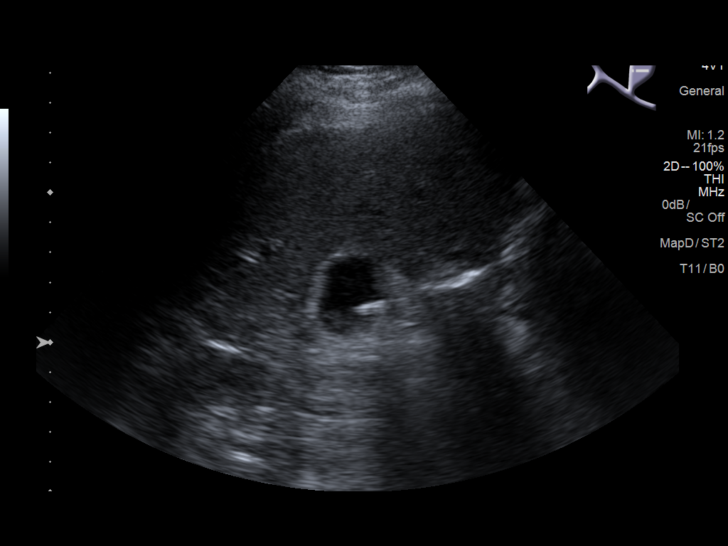
[im 14/56]
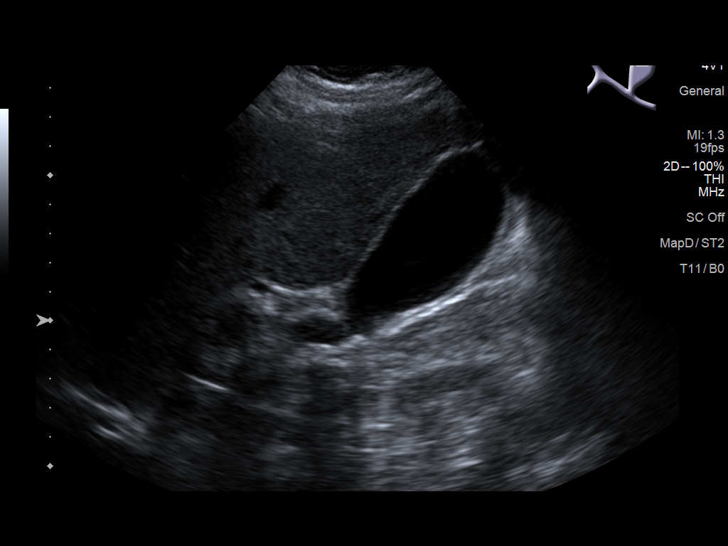
[im 19/56]
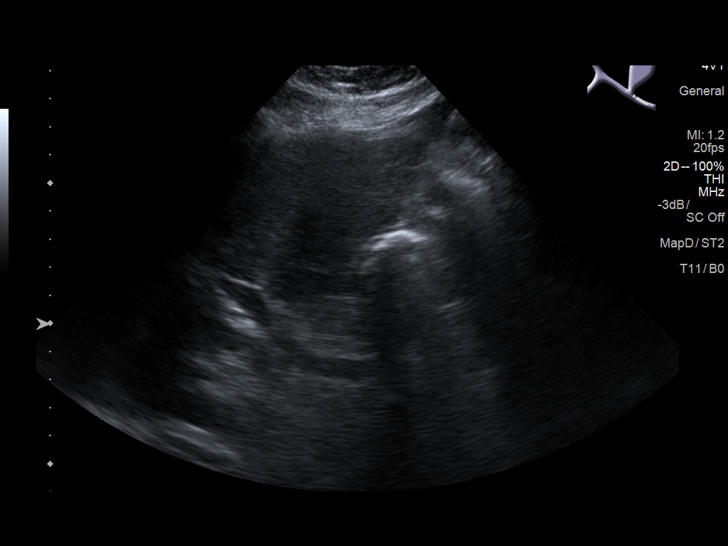
[im 23/56]
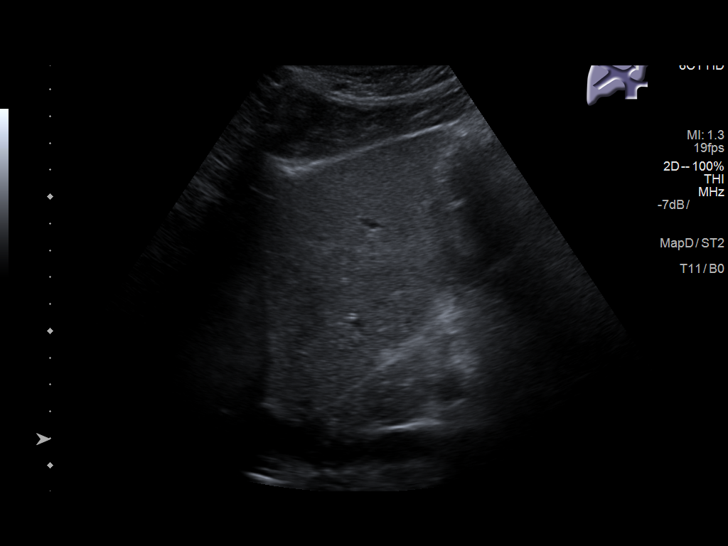
[im 28/56]
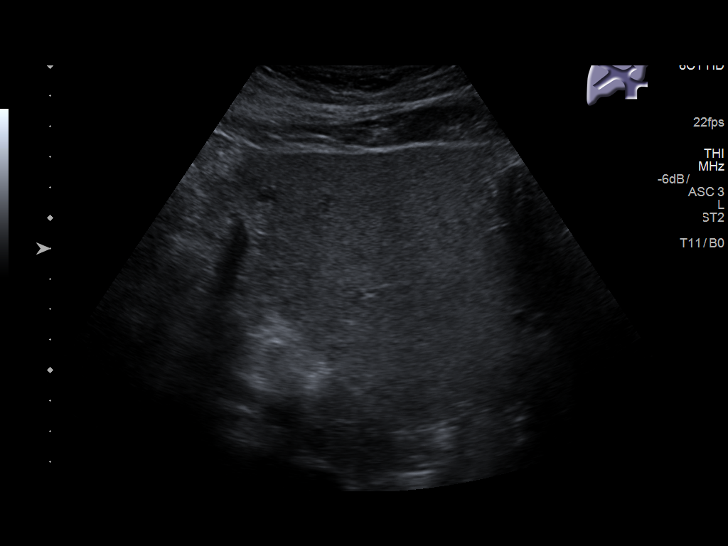
[im 33/56]
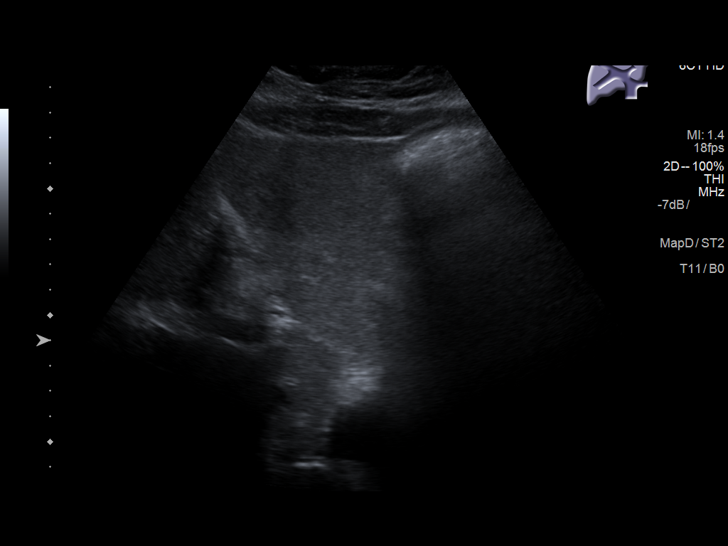
[im 37/56]
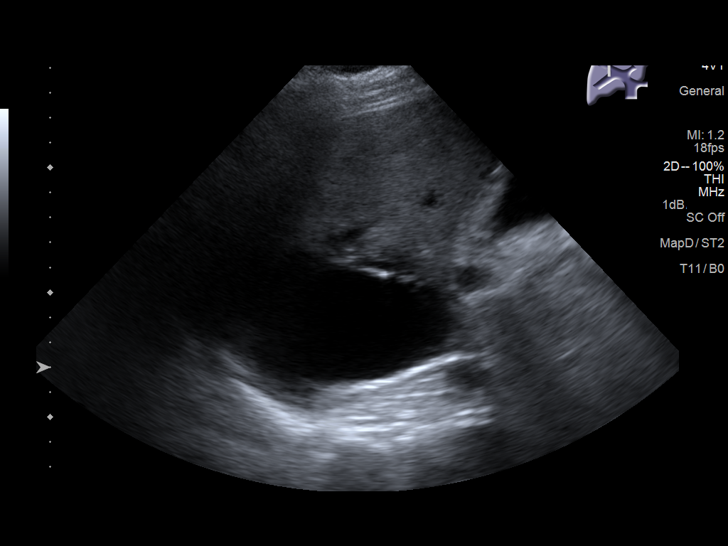
[im 42/56]
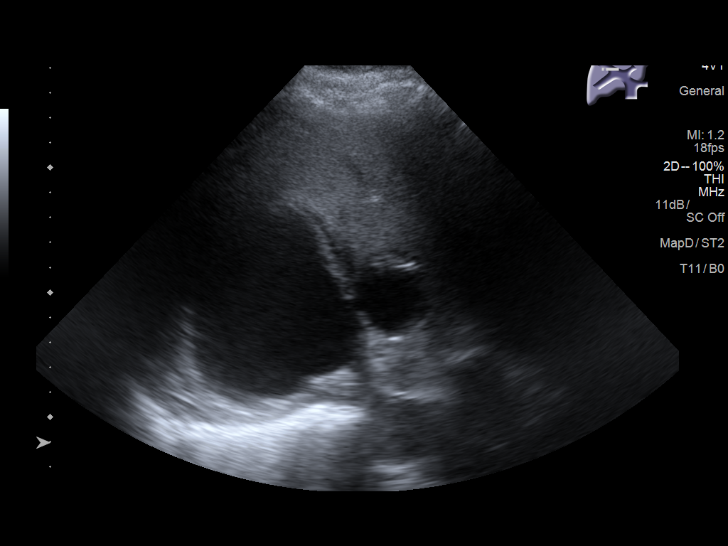
[im 46/56]
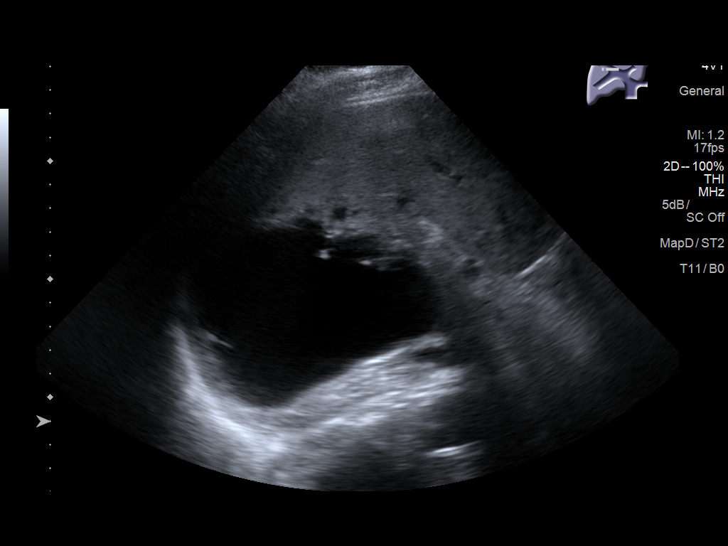
[im 51/56]
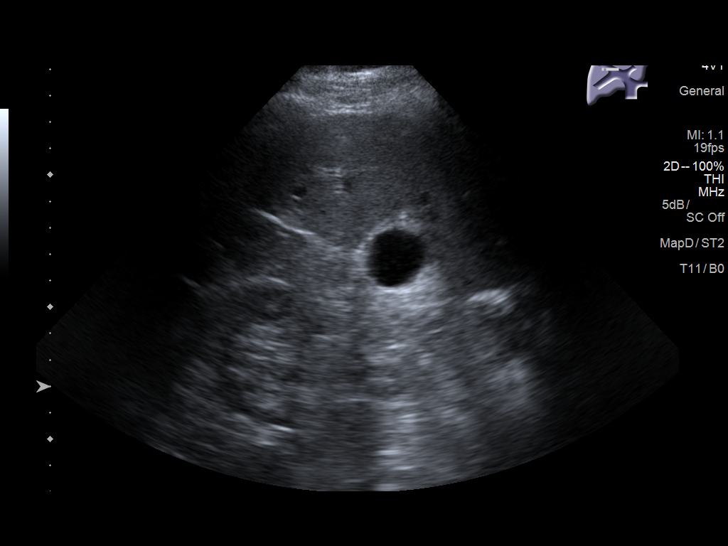
[im 56/56]
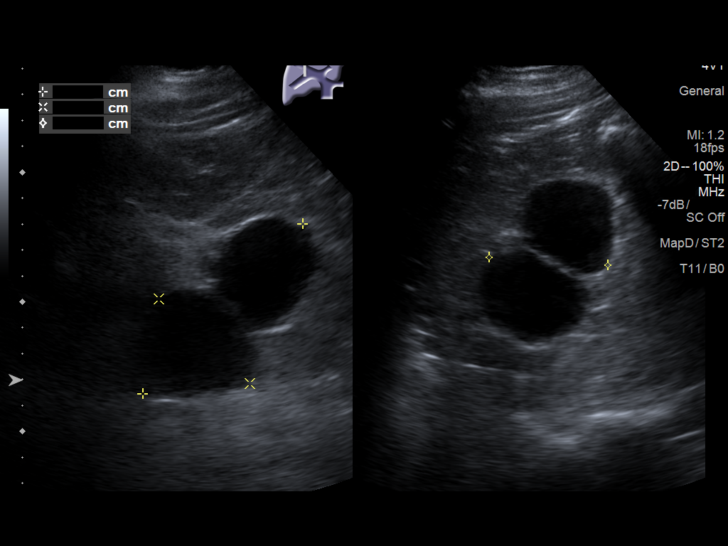

[13 of 25 positions shown; findings below may reference images not displayed]

FINDINGS: Gallbladder:

Within the gallbladder, there are echogenic foci which move and
shadow consistent with cholelithiasis. Several gallstones are noted
in the neck of the gallbladder region. Largest gallstone measures 7
mm in length. There is no gallbladder wall thickening or
pericholecystic fluid. No sonographic Murphy sign noted by
sonographer.

Common bile duct:

Diameter: 9 mm, prominent. No intrahepatic biliary duct dilatation.
No mass or calculus seen in the common bile duct. Note that portions
of the distal common bile duct are obscured by gas, however.

Liver:

There is a cyst in the left lobe of the liver measuring 0.8 x 0.7 x
0.6 cm. A second cyst in the left lobe measures 1.1 x 0.9 x 0.9 cm.
A cyst in the right lobe of the liver measures 9.3 x 8.2 x 11.0 cm.
This cyst shows mild septation.

There is a bilobed cyst arising from the right kidney measuring
x 4.8 x 4.6 cm. There is renal cortical thinning on the right.
Within normal limits in parenchymal echogenicity. Portal vein is
patent on color Doppler imaging with normal direction of blood flow
towards the liver.
IMPRESSION: 1. Cholelithiasis with gallstones present in the neck of the
gallbladder. No gallbladder wall thickening or pericholecystic
fluid.

2. Prominent common hepatic and proximal common bile duct. No
biliary duct mass or calculus evident. Note, however, that portions
common bile duct are obscured by gas.

3. Dominant mildly complex cyst in the right lobe of the liver.
Small cysts in left lobe of liver.

4. Bilobed cyst arising from lower pole right kidney. Renal cortical
thinning on the right may be a function of age but also may indicate
a degree of underlying medical renal disease. Right renal
echogenicity is normal.

## 2021-07-31 ENCOUNTER — Other Ambulatory Visit: Payer: Self-pay | Admitting: Internal Medicine

## 2021-07-31 DIAGNOSIS — R634 Abnormal weight loss: Secondary | ICD-10-CM

## 2021-08-11 ENCOUNTER — Other Ambulatory Visit: Payer: Self-pay | Admitting: Physical Medicine & Rehabilitation

## 2021-08-11 DIAGNOSIS — G8929 Other chronic pain: Secondary | ICD-10-CM

## 2021-08-11 DIAGNOSIS — M48062 Spinal stenosis, lumbar region with neurogenic claudication: Secondary | ICD-10-CM

## 2021-08-12 ENCOUNTER — Other Ambulatory Visit: Payer: Self-pay

## 2021-08-12 ENCOUNTER — Ambulatory Visit
Admission: RE | Admit: 2021-08-12 | Discharge: 2021-08-12 | Disposition: A | Discharge status: Home / Self Care | Payer: Medicare Other | Source: Ambulatory Visit | Attending: Internal Medicine | Admitting: Internal Medicine

## 2021-08-12 DIAGNOSIS — R634 Abnormal weight loss: Secondary | ICD-10-CM | POA: Diagnosis not present

## 2021-08-19 ENCOUNTER — Other Ambulatory Visit: Payer: Self-pay | Admitting: Internal Medicine

## 2021-08-19 DIAGNOSIS — K769 Liver disease, unspecified: Secondary | ICD-10-CM

## 2021-08-19 DIAGNOSIS — K7689 Other specified diseases of liver: Secondary | ICD-10-CM

## 2021-08-20 ENCOUNTER — Ambulatory Visit
Admission: RE | Admit: 2021-08-20 | Discharge: 2021-08-20 | Disposition: A | Discharge status: Home / Self Care | Payer: Medicare Other | Source: Ambulatory Visit | Attending: Physical Medicine & Rehabilitation | Admitting: Physical Medicine & Rehabilitation

## 2021-08-20 ENCOUNTER — Other Ambulatory Visit: Payer: Self-pay

## 2021-08-20 ENCOUNTER — Ambulatory Visit
Admission: RE | Admit: 2021-08-20 | Discharge: 2021-08-20 | Disposition: A | Discharge status: Home / Self Care | Payer: Medicare Other | Source: Ambulatory Visit | Attending: Internal Medicine | Admitting: Internal Medicine

## 2021-08-20 DIAGNOSIS — K769 Liver disease, unspecified: Secondary | ICD-10-CM | POA: Diagnosis not present

## 2021-08-20 DIAGNOSIS — M48062 Spinal stenosis, lumbar region with neurogenic claudication: Secondary | ICD-10-CM | POA: Diagnosis present

## 2021-08-20 DIAGNOSIS — G8929 Other chronic pain: Secondary | ICD-10-CM | POA: Diagnosis present

## 2021-08-20 DIAGNOSIS — K7689 Other specified diseases of liver: Secondary | ICD-10-CM | POA: Diagnosis present

## 2021-08-20 DIAGNOSIS — M5442 Lumbago with sciatica, left side: Secondary | ICD-10-CM | POA: Diagnosis present

## 2021-08-20 MED ORDER — GADOBUTROL 1 MMOL/ML IV SOLN
7.5000 mL | Freq: Once | INTRAVENOUS | Status: AC | PRN
  Administered 2021-08-20: 7.5 mL via INTRAVENOUS

## 2022-02-19 DIAGNOSIS — N62 Hypertrophy of breast: Secondary | ICD-10-CM | POA: Insufficient documentation

## 2022-05-11 ENCOUNTER — Other Ambulatory Visit: Payer: Self-pay | Admitting: Internal Medicine

## 2022-05-11 DIAGNOSIS — N6459 Other signs and symptoms in breast: Secondary | ICD-10-CM

## 2022-05-11 DIAGNOSIS — N644 Mastodynia: Secondary | ICD-10-CM

## 2022-05-11 DIAGNOSIS — L819 Disorder of pigmentation, unspecified: Secondary | ICD-10-CM

## 2022-05-19 ENCOUNTER — Ambulatory Visit
Admission: RE | Admit: 2022-05-19 | Discharge: 2022-05-19 | Disposition: A | Discharge status: Home / Self Care | Payer: Medicare Other | Source: Ambulatory Visit | Attending: Internal Medicine | Admitting: Internal Medicine

## 2022-05-19 DIAGNOSIS — N6459 Other signs and symptoms in breast: Secondary | ICD-10-CM

## 2022-05-19 DIAGNOSIS — N644 Mastodynia: Secondary | ICD-10-CM

## 2022-05-19 DIAGNOSIS — L819 Disorder of pigmentation, unspecified: Secondary | ICD-10-CM | POA: Insufficient documentation

## 2022-05-21 ENCOUNTER — Other Ambulatory Visit: Payer: Self-pay | Admitting: Internal Medicine

## 2022-05-21 DIAGNOSIS — N63 Unspecified lump in unspecified breast: Secondary | ICD-10-CM

## 2022-05-21 DIAGNOSIS — R928 Other abnormal and inconclusive findings on diagnostic imaging of breast: Secondary | ICD-10-CM

## 2022-06-02 ENCOUNTER — Ambulatory Visit
Admission: RE | Admit: 2022-06-02 | Discharge: 2022-06-02 | Disposition: A | Discharge status: Home / Self Care | Payer: Medicare Other | Source: Ambulatory Visit | Attending: Internal Medicine | Admitting: Internal Medicine

## 2022-06-02 DIAGNOSIS — N63 Unspecified lump in unspecified breast: Secondary | ICD-10-CM | POA: Diagnosis present

## 2022-06-02 DIAGNOSIS — R928 Other abnormal and inconclusive findings on diagnostic imaging of breast: Secondary | ICD-10-CM | POA: Diagnosis present

## 2022-06-02 HISTORY — PX: BREAST BIOPSY: SHX20

## 2022-06-03 ENCOUNTER — Encounter: Payer: Self-pay | Admitting: *Deleted

## 2022-06-03 DIAGNOSIS — C50919 Malignant neoplasm of unspecified site of unspecified female breast: Secondary | ICD-10-CM

## 2022-06-03 NOTE — Progress Notes (Signed)
Received referral for newly diagnosed breast cancer from [facility].  Navigation initiated. He is scheduled to see Dr. Peyton Najjar on Monday 8/7 at 3:15 and Dr. Grayland Ormond 8/8 at 10:45.  Appt. Info and instructions given to the patient.

## 2022-06-04 LAB — SURGICAL PATHOLOGY

## 2022-06-07 ENCOUNTER — Ambulatory Visit: Payer: Self-pay | Admitting: General Surgery

## 2022-06-07 NOTE — H&P (View-Only) (Signed)
PATIENT PROFILE: Gerald Hunter is a 88 y.o. male who presents to the Clinic for consultation at the request of Dr. Ouida Sills for evaluation of left breast cancer.  PCP:  Gerald Donath, MD  HISTORY OF PRESENT ILLNESS: Mr. Branch reports he was feeling itching around his left nipple area for about the last 3 months.  He denies any breast pain.  No pain radiation.  No alleviating or aggravating factors.  He notify his primary care who ordered a diagnostic mammogram and ultrasound.  Mammogram and ultrasound of the left breast shows a 2.5 cm mass.  Core needle biopsy was done showing invasive mammary carcinoma, no special type.  Mass is ER/PR positive, HER2 negative.  There was no suspicious mass on the right chest.  I personally evaluated the images of the diagnostic mammogram and ultrasound.  Family history of breast cancer: None History of Radiation to the chest: Denies Patient has 2 sons and 3 daughters.   PROBLEM LIST: Problem List  Date Reviewed: 04/14/2022          Noted   Gynecomastia 02/19/2022   Overview    mild       Chronic bilateral low back pain with right-sided sciatica 10/14/2020   Foraminal stenosis of lumbar region 10/14/2020   Paroxysmal atrial fibrillation (CMS-HCC) 02/07/2020   Overview    On screening study, less than 1% of the time (48 min out of 2 weeks), not wanting ac or more wu       Cholelithiases 01/11/2019   Healthcare maintenance 07/07/2017   Overview    MEDICARE WELLNESS VISIT   PROVIDERS RENDERING CARE Dr. Ouida Sills, [place] [facility], [facility]   FUNCTIONAL ASSESSMENT  (1) Hearing: Demonstrates normal hearing in conversation.  (2) Risk of Falls: No reports of falls or abnormal balance. Gait is observed to be good upon observation.  (3) Home Safety; Home is safe and secure (4) Activities of Daily Living; Household chores and grooming are managed without problems. Personal finances are managed without problems.    DEPRESSION SCREENING There  does not seem to be loss of interest in activities nor excess crying or changes in sleep or appetite.    COGNITIVE SCREENING Orientation is appropriate as are responses to questions and general conversation. No reports of forgetfulness or losing things.      PREVENTION PLAN Cardiovascular; Every 6 mo cholesterol on atorvastatin Diabetes; Every 6 months with ckd labs Bone Density; On vit d and exercise with history of osteoporosis Colon Cancer; History of polyps, no more screening as last colonoscopy was at age 72 Glaucoma; yearly at [facility] Pneumonia; Pneumovax post 66 and prevnar 13 in 9-18 Shingles; Vaccinated 2011 with a zostavax, shingrix has been obtained Covid; 2-21 Yellow fever; 2016 Influenza; Yearly Smoking Cessation; Did distant     OTHER PERSONALIZED HEALTH ADVISE Continue frequent stretching and cardiovascular exercise. Low fat diet needed.    END OF LIFE CARE WANTS Full code but no long term support.   Frazier Richards MD          Health care maintenance 06/19/2015   Overview    Had pneumovax in [place] since 67. Zostavax 2011. Colonoscopy 13, 19  with polyp hx. Yellow fever vaccine in 2016, Hep A started. [facility] yearly, stretches and exercises. Full code, no long term support.   MEDICARE WELLNESS VISIT   PROVIDERS RENDERING CARE Dr. Ouida Sills,  eye, [facility]   FUNCTIONAL ASSESSMENT  (1) Hearing: Demonstrates normal hearing in conversation.  (2) Risk  of Falls: No reports of falls or abnormal balance. Gait is observed to be good upon observation.  (3) Home Safety; Home is safe and secure (4) Activities of Daily Living; Household chores and grooming are managed without problems. Personal finances are managed without problems.    DEPRESSION SCREENING There does not seem to be loss of interest in activities nor excess crying or changes in sleep or appetite.    COGNITIVE SCREENING Orientation is appropriate as are responses to questions  and general conversation. No reports of forgetfulness or losing things.      PREVENTION PLAN Cardiovascular; Every 6 mo cholesterol on atorvastatin Diabetes; Every 6 months with ckd labs Bone Density; On vit d and exercise with history of osteoporosis Colon Cancer; History of polyps, no more screening as last colonoscopy was at age 61 Glaucoma; yearly at [place] [facility] Pneumonia; Pneumovax post 63 and prevnar 13 in 9-18 Shingles; Vaccinated 2011 with a zostavax, shingrix has been obtained Yellow fever; 2016 Influenza; Yearly Smoking Cessation; Did distant     OTHER PERSONALIZED HEALTH ADVISE Continue frequent stretching and cardiovascular exercise. Low fat diet needed.    END OF LIFE CARE WANTS Full code but no long term support.    Frazier Richards MD           ED (erectile dysfunction) 06/13/2014   Hyperlipidemia 05/26/2014   CKD (chronic kidney disease), stage III (CMS-HCC) 05/26/2014   OA (osteoarthritis) 05/26/2014   Osteoporosis 05/26/2014    GENERAL REVIEW OF SYSTEMS:   General ROS: negative for - chills, fatigue, fever, weight gain or weight loss Allergy and Immunology ROS: negative for - hives  Hematological and Lymphatic ROS: negative for - bleeding problems or bruising, negative for palpable nodes Endocrine ROS: negative for - heat or cold intolerance, hair changes Respiratory ROS: negative for - cough, shortness of breath or wheezing Cardiovascular ROS: no chest pain or palpitations GI ROS: negative for nausea, vomiting, abdominal pain, diarrhea, constipation Musculoskeletal ROS: negative for - joint swelling or muscle pain Neurological ROS: negative for - confusion, syncope Dermatological ROS: negative for pruritus and rash Psychiatric: negative for anxiety, depression, difficulty sleeping and memory loss  MEDICATIONS: Current Outpatient Medications  Medication Sig Dispense Refill   alpha lipoic acid 600 mg Cap capsule Take 600 mg by mouth 2 (two) times  daily     aspirin 325 MG tablet Take by mouth     atorvastatin (LIPITOR) 10 MG tablet Take 1 tablet (10 mg total) by mouth once daily 90 tablet 3   CALCIUM CARBONATE/VITAMIN D3 (CALCIUM 600 + D,3, ORAL) Take 1 tablet by mouth once daily.     cholecalciferol (VITAMIN D3) 1000 unit tablet Take 1,000 Units by mouth once daily     cyanocobalamin (VITAMIN B12) 1000 MCG tablet Take 1,000 mcg by mouth once daily     gabapentin (NEURONTIN) 100 MG capsule 1 po qHS 30 capsule 5   ipratropium (ATROVENT) 21 mcg (0.03 %) nasal spray Place 2 sprays into both nostrils 2 (two) times daily 30 mL 11   ketoconazole (NIZORAL) 2 % shampoo APPLY 5 TO 10 MLS TO WET SCALP - LATHER, LEAVE ON FOR 3 TO 5 MINUTES, THEN RINSE. APPLY TWICE WEEKLY FOR 2 TO 4 WEEKS     neomycin-polymyxin-dexAMETHasone (MAXITROL) ophthalmic ointment APPLY A SMALL AMOUNT ON THE EYELID TWICE DAILY     No current facility-administered medications for this visit.    ALLERGIES: Patient has no known allergies.  PAST MEDICAL HISTORY: Past Medical History:  Diagnosis  Date   Arthritis    Atrial fibrillation (CMS-HCC)    No trearment prescribed   Cataract cortical, senile    CKD (chronic kidney disease), stage III (CMS-HCC)    Colon polyps    GERD (gastroesophageal reflux disease)    Hyperlipidemia    Neuropathy Just started in [place]   Osteoporosis, post-menopausal    Tremor Approx. 2018   Tubular adenoma of colon 02/27/2016    PAST SURGICAL HISTORY: Past Surgical History:  Procedure Laterality Date   COLONOSCOPY  04/18/2004   1 TA in distal sigmoid colon, 1 TVA in cecum, diverticulosis   COLONOSCOPY  12/04/2007   Diverticulosis   UPPER GASTROINTESTINAL ENDOSCOPY  12/04/2007   Gastritis, reflux esophagitis, hiatal hernia   COLONOSCOPY  02/27/2016   Tubular adenoma of colon/No Repeat/MGR     FAMILY HISTORY: Family History  Problem Relation Age of Onset   Pneumonia Mother    Alzheimer's disease Mother    Parkinsonism Mother     Other Father        heart problems   Other Sister        liver failure   Diabetes type II Brother    Parkinsonism Brother    Alzheimer's disease Brother    Cancer Son    Colon cancer Neg Hx    Colon polyps Neg Hx    Inflammatory bowel disease Neg Hx      SOCIAL HISTORY: Social History   Socioeconomic History   Marital status: Married  Tobacco Use   Smoking status: Former    Packs/day: 0.00    Years: 0.00    Pack years: 0.00    Types: Cigarettes    Quit date: 12/17/1958    Years since quitting: 63.5   Smokeless tobacco: Never  Substance and Sexual Activity   Alcohol use: Yes    Alcohol/week: 4.0 standard drinks    Types: 1 Cans of beer, 3 Shots of liquor per week   Drug use: No   Sexual activity: Yes    Partners: Female    Birth control/protection: None    PHYSICAL EXAM: Vitals:   06/07/22 1523  BP: (!) 152/90  Pulse: 93   Body mass index is 28.51 kg/m. Weight: 82.6 kg (182 lb)   GENERAL: Alert, active, oriented x3  HEENT: Pupils equal reactive to light. Extraocular movements are intact. Sclera clear. Palpebral conjunctiva normal red color.Pharynx clear.  NECK: Supple with no palpable mass and no adenopathy.  LUNGS: Sound clear with no rales rhonchi or wheezes.  HEART: Regular rhythm S1 and S2 without murmur.  BREAST: right breast normal without mass, skin or nipple changes or axillary nodes.  Left breast abnormal mass palpable at the nipple areolar complex.  There is no palpable axillary lymph node.  ABDOMEN: Soft and depressible, nontender with no palpable mass, no hepatomegaly.  EXTREMITIES: Well-developed well-nourished symmetrical with no dependent edema.  NEUROLOGICAL: Awake alert oriented, facial expression symmetrical, moving all extremities.  REVIEW OF DATA: I have reviewed the following data today: No visits with results within 3 Month(s) from this visit.  Latest known visit with results is:  Office Visit on 02/19/2022  Component Date  Value   Vent Rate (bpm) 02/19/2022 69    PR Interval (msec) 02/19/2022 168    QRS Interval (msec) 02/19/2022 86    QT Interval (msec) 02/19/2022 380    QTc (msec) 02/19/2022 407      ASSESSMENT: Mr. Alban is a 88 y.o. male presenting for consultation for left  breast cancer.    Patient was oriented again about the pathology results. Surgical alternatives were discussed with patient including total mastectomy. Surgical technique and post operative care was discussed with patient. Risk of surgery was discussed with patient including but not limited to: wound infection, seroma, hematoma, brachial plexopathy, mondor's disease (thrombosis of small veins of breast), chronic wound pain, breast lymphedema, altered sensation to the nipple and cosmesis among others.   We discussed about the recommendations having genetic testing.  Due to patient age we discussed about even with genetic testing if he would like to have prophylactic right mastectomy if BRCA positive.  He endorses that he would like to avoid a right prophylactic mastectomy.  I think that is reasonable to still have genetic testing for BRCA and other genetic abnormalities more for having incessant information for the family such as daughters and sounds for further screening for high risk patient if he is BRCA positive.  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   Hold aspirin 5 days before surgery 3.   Contact us if you have any concern.   Patient and his wife verbalized understanding, all questions were answered, and were agreeable with the plan outlined above.     Herbert Pun, MD

## 2022-06-07 NOTE — H&P (Signed)
PATIENT PROFILE: Gerald Hunter is a 88 y.o. male who presents to the Clinic for consultation at the request of Dr. Ouida Sills for evaluation of left breast cancer.  PCP:  Harrold Donath, MD  HISTORY OF PRESENT ILLNESS: Gerald Hunter reports he was feeling itching around his left nipple area for about the last 3 months.  He denies any breast pain.  No pain radiation.  No alleviating or aggravating factors.  He notify his primary care who ordered a diagnostic mammogram and ultrasound.  Mammogram and ultrasound of the left breast shows a 2.5 cm mass.  Core needle biopsy was done showing invasive mammary carcinoma, no special type.  Mass is ER/PR positive, HER2 negative.  There was no suspicious mass on the right chest.  I personally evaluated the images of the diagnostic mammogram and ultrasound.  Family history of breast cancer: None History of Radiation to the chest: Denies Patient has 2 sons and 3 daughters.   PROBLEM LIST: Problem List  Date Reviewed: 04/14/2022          Noted   Gynecomastia 02/19/2022   Overview    mild       Chronic bilateral low back pain with right-sided sciatica 10/14/2020   Foraminal stenosis of lumbar region 10/14/2020   Paroxysmal atrial fibrillation (CMS-HCC) 02/07/2020   Overview    On screening study, less than 1% of the time (48 min out of 2 weeks), not wanting ac or more wu       Cholelithiases 01/11/2019   Healthcare maintenance 07/07/2017   Overview    MEDICARE WELLNESS VISIT   PROVIDERS RENDERING CARE Dr. Ouida Sills, [facility], [facility]   FUNCTIONAL ASSESSMENT  (1) Hearing: Demonstrates normal hearing in conversation.  (2) Risk of Falls: No reports of falls or abnormal balance. Gait is observed to be good upon observation.  (3) Home Safety; Home is safe and secure (4) Activities of Daily Living; Household chores and grooming are managed without problems. Personal finances are managed without problems.    DEPRESSION SCREENING There  does not seem to be loss of interest in activities nor excess crying or changes in sleep or appetite.    COGNITIVE SCREENING Orientation is appropriate as are responses to questions and general conversation. No reports of forgetfulness or losing things.      PREVENTION PLAN Cardiovascular; Every 6 mo cholesterol on atorvastatin Diabetes; Every 6 months with ckd labs Bone Density; On vit d and exercise with history of osteoporosis Colon Cancer; History of polyps, no more screening as last colonoscopy was at age 37 Glaucoma; yearly at [facility] Pneumonia; Pneumovax post 58 and prevnar 13 in 9-18 Shingles; Vaccinated 2011 with a zostavax, shingrix has been obtained Covid; 2-21 Yellow fever; 2016 Influenza; Yearly Smoking Cessation; Did distant     OTHER PERSONALIZED HEALTH ADVISE Continue frequent stretching and cardiovascular exercise. Low fat diet needed.    END OF LIFE CARE WANTS Full code but no long term support.   Frazier Richards MD          Health care maintenance 06/19/2015   Overview    Had pneumovax in [place] since 67. Zostavax 2011. Colonoscopy 13, 19  with polyp hx. Yellow fever vaccine in 2016, Hep A started. [facility] yearly, stretches and exercises. Full code, no long term support.   MEDICARE WELLNESS VISIT   PROVIDERS RENDERING CARE Dr. Ouida Sills,  [facility], [facility]   FUNCTIONAL ASSESSMENT  (1) Hearing: Demonstrates normal hearing in conversation.  (2) Risk  of Falls: No reports of falls or abnormal balance. Gait is observed to be good upon observation.  (3) Home Safety; Home is safe and secure (4) Activities of Daily Living; Household chores and grooming are managed without problems. Personal finances are managed without problems.    DEPRESSION SCREENING There does not seem to be loss of interest in activities nor excess crying or changes in sleep or appetite.    COGNITIVE SCREENING Orientation is appropriate as are responses to questions  and general conversation. No reports of forgetfulness or losing things.      PREVENTION PLAN Cardiovascular; Every 6 mo cholesterol on atorvastatin Diabetes; Every 6 months with ckd labs Bone Density; On vit d and exercise with history of osteoporosis Colon Cancer; History of polyps, no more screening as last colonoscopy was at age 89 Glaucoma; yearly at [facility] Pneumonia; Pneumovax post 7 and prevnar 13 in 9-18 Shingles; Vaccinated 2011 with a zostavax, shingrix has been obtained Yellow fever; 2016 Influenza; Yearly Smoking Cessation; Did distant     OTHER PERSONALIZED HEALTH ADVISE Continue frequent stretching and cardiovascular exercise. Low fat diet needed.    END OF LIFE CARE WANTS Full code but no long term support.    Frazier Richards MD           ED (erectile dysfunction) 06/13/2014   Hyperlipidemia 05/26/2014   CKD (chronic kidney disease), stage III (CMS-HCC) 05/26/2014   OA (osteoarthritis) 05/26/2014   Osteoporosis 05/26/2014    GENERAL REVIEW OF SYSTEMS:   General ROS: negative for - chills, fatigue, fever, weight gain or weight loss Allergy and Immunology ROS: negative for - hives  Hematological and Lymphatic ROS: negative for - bleeding problems or bruising, negative for palpable nodes Endocrine ROS: negative for - heat or cold intolerance, hair changes Respiratory ROS: negative for - cough, shortness of breath or wheezing Cardiovascular ROS: no chest pain or palpitations GI ROS: negative for nausea, vomiting, abdominal pain, diarrhea, constipation Musculoskeletal ROS: negative for - joint swelling or muscle pain Neurological ROS: negative for - confusion, syncope Dermatological ROS: negative for pruritus and rash Psychiatric: negative for anxiety, depression, difficulty sleeping and memory loss  MEDICATIONS: Current Outpatient Medications  Medication Sig Dispense Refill   alpha lipoic acid 600 mg Cap capsule Take 600 mg by mouth 2 (two) times  daily     aspirin 325 MG tablet Take by mouth     atorvastatin (LIPITOR) 10 MG tablet Take 1 tablet (10 mg total) by mouth once daily 90 tablet 3   CALCIUM CARBONATE/VITAMIN D3 (CALCIUM 600 + D,3, ORAL) Take 1 tablet by mouth once daily.     cholecalciferol (VITAMIN D3) 1000 unit tablet Take 1,000 Units by mouth once daily     cyanocobalamin (VITAMIN B12) 1000 MCG tablet Take 1,000 mcg by mouth once daily     gabapentin (NEURONTIN) 100 MG capsule 1 po qHS 30 capsule 5   ipratropium (ATROVENT) 21 mcg (0.03 %) nasal spray Place 2 sprays into both nostrils 2 (two) times daily 30 mL 11   ketoconazole (NIZORAL) 2 % shampoo APPLY 5 TO 10 MLS TO WET SCALP - LATHER, LEAVE ON FOR 3 TO 5 MINUTES, THEN RINSE. APPLY TWICE WEEKLY FOR 2 TO 4 WEEKS     neomycin-polymyxin-dexAMETHasone (MAXITROL) ophthalmic ointment APPLY A SMALL AMOUNT ON THE EYELID TWICE DAILY     No current facility-administered medications for this visit.    ALLERGIES: Patient has no known allergies.  PAST MEDICAL HISTORY: Past Medical History:  Diagnosis  Date   Arthritis    Atrial fibrillation (CMS-HCC)    No trearment prescribed   Cataract cortical, senile    CKD (chronic kidney disease), stage III (CMS-HCC)    Colon polyps    GERD (gastroesophageal reflux disease)    Hyperlipidemia    Neuropathy Just started in [place]   Osteoporosis, post-menopausal    Tremor Approx. 2018   Tubular adenoma of colon 02/27/2016    PAST SURGICAL HISTORY: Past Surgical History:  Procedure Laterality Date   COLONOSCOPY  04/18/2004   1 TA in distal sigmoid colon, 1 TVA in cecum, diverticulosis   COLONOSCOPY  12/04/2007   Diverticulosis   UPPER GASTROINTESTINAL ENDOSCOPY  12/04/2007   Gastritis, reflux esophagitis, hiatal hernia   COLONOSCOPY  02/27/2016   Tubular adenoma of colon/No Repeat/MGR     FAMILY HISTORY: Family History  Problem Relation Age of Onset   Pneumonia Mother    Alzheimer's disease Mother    Parkinsonism Mother     Other Father        heart problems   Other Sister        liver failure   Diabetes type II Brother    Parkinsonism Brother    Alzheimer's disease Brother    Cancer Son    Colon cancer Neg Hx    Colon polyps Neg Hx    Inflammatory bowel disease Neg Hx      SOCIAL HISTORY: Social History   Socioeconomic History   Marital status: Married  Tobacco Use   Smoking status: Former    Packs/day: 0.00    Years: 0.00    Pack years: 0.00    Types: Cigarettes    Quit date: 12/17/1958    Years since quitting: 63.5   Smokeless tobacco: Never  Substance and Sexual Activity   Alcohol use: Yes    Alcohol/week: 4.0 standard drinks    Types: 1 Cans of beer, 3 Shots of liquor per week   Drug use: No   Sexual activity: Yes    Partners: Female    Birth control/protection: None    PHYSICAL EXAM: Vitals:   06/07/22 1523  BP: (!) 152/90  Pulse: 93   Body mass index is 28.51 kg/m. Weight: 82.6 kg (182 lb)   GENERAL: Alert, active, oriented x3  HEENT: Pupils equal reactive to light. Extraocular movements are intact. Sclera clear. Palpebral conjunctiva normal red color.Pharynx clear.  NECK: Supple with no palpable mass and no adenopathy.  LUNGS: Sound clear with no rales rhonchi or wheezes.  HEART: Regular rhythm S1 and S2 without murmur.  BREAST: right breast normal without mass, skin or nipple changes or axillary nodes.  Left breast abnormal mass palpable at the nipple areolar complex.  There is no palpable axillary lymph node.  ABDOMEN: Soft and depressible, nontender with no palpable mass, no hepatomegaly.  EXTREMITIES: Well-developed well-nourished symmetrical with no dependent edema.  NEUROLOGICAL: Awake alert oriented, facial expression symmetrical, moving all extremities.  REVIEW OF DATA: I have reviewed the following data today: No visits with results within 3 Month(s) from this visit.  Latest known visit with results is:  Office Visit on 02/19/2022  Component Date  Value   Vent Rate (bpm) 02/19/2022 69    PR Interval (msec) 02/19/2022 168    QRS Interval (msec) 02/19/2022 86    QT Interval (msec) 02/19/2022 380    QTc (msec) 02/19/2022 407      ASSESSMENT: Mr. Highley is a 88 y.o. male presenting for consultation for left  breast cancer.    Patient was oriented again about the pathology results. Surgical alternatives were discussed with patient including total mastectomy. Surgical technique and post operative care was discussed with patient. Risk of surgery was discussed with patient including but not limited to: wound infection, seroma, hematoma, brachial plexopathy, mondor's disease (thrombosis of small veins of breast), chronic wound pain, breast lymphedema, altered sensation to the nipple and cosmesis among others.   We discussed about the recommendations having genetic testing.  Due to patient age we discussed about even with genetic testing if he would like to have prophylactic right mastectomy if BRCA positive.  He endorses that he would like to avoid a right prophylactic mastectomy.  I think that is reasonable to still have genetic testing for BRCA and other genetic abnormalities more for having incessant information for the family such as daughters and sounds for further screening for high risk patient if he is BRCA positive.  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   Hold aspirin 5 days before surgery 3.   Contact us if you have any concern.   Patient and his wife verbalized understanding, all questions were answered, and were agreeable with the plan outlined above.     Herbert Pun, MD

## 2022-06-08 ENCOUNTER — Inpatient Hospital Stay: Payer: Medicare Other | Attending: Oncology | Admitting: Oncology

## 2022-06-08 ENCOUNTER — Inpatient Hospital Stay: Payer: Medicare Other

## 2022-06-08 ENCOUNTER — Encounter: Payer: Self-pay | Admitting: *Deleted

## 2022-06-08 ENCOUNTER — Other Ambulatory Visit: Payer: Self-pay | Admitting: General Surgery

## 2022-06-08 ENCOUNTER — Encounter: Payer: Self-pay | Admitting: Oncology

## 2022-06-08 VITALS — BP 138/83 | HR 70 | Temp 96.4°F | Resp 18 | Ht 67.0 in | Wt 182.0 lb

## 2022-06-08 DIAGNOSIS — C50822 Malignant neoplasm of overlapping sites of left male breast: Secondary | ICD-10-CM | POA: Diagnosis present

## 2022-06-08 DIAGNOSIS — Z170 Estrogen receptor positive status [ER+]: Secondary | ICD-10-CM | POA: Insufficient documentation

## 2022-06-08 DIAGNOSIS — C50922 Malignant neoplasm of unspecified site of left male breast: Secondary | ICD-10-CM | POA: Diagnosis not present

## 2022-06-08 DIAGNOSIS — Z87891 Personal history of nicotine dependence: Secondary | ICD-10-CM | POA: Diagnosis not present

## 2022-06-08 DIAGNOSIS — C50022 Malignant neoplasm of nipple and areola, left male breast: Secondary | ICD-10-CM

## 2022-06-08 DIAGNOSIS — Z800 Family history of malignant neoplasm of digestive organs: Secondary | ICD-10-CM | POA: Insufficient documentation

## 2022-06-08 DIAGNOSIS — C50919 Malignant neoplasm of unspecified site of unspecified female breast: Secondary | ICD-10-CM

## 2022-06-08 NOTE — Progress Notes (Signed)
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  INTERVAL HISTORY: Patient is an 88 year old male who was recently diagnosed with the above-stated malignancy.  He is anxious, but otherwise feels well.  He has no neurologic complaints.  He denies any recent fevers or illnesses.  Has good appetite and denies weight loss.  He has no chest pain, shortness of breath, cough, or hemoptysis.  He denies any nausea, vomiting, constipation, or diarrhea.  He has no urinary complaints.  Patient otherwise feels well and offers no further specific complaints today.  REVIEW OF SYSTEMS:   Review of Systems  Constitutional: Negative.  Negative for fever, malaise/fatigue and weight loss.  Respiratory: Negative.  Negative for cough, hemoptysis and shortness of breath.   Cardiovascular: Negative.  Negative for chest pain and leg swelling.  Gastrointestinal: Negative.  Negative for abdominal pain.  Genitourinary: Negative.  Negative for dysuria.  Musculoskeletal: Negative.  Negative for back pain.  Skin: Negative.  Negative for rash.  Neurological: Negative.  Negative for dizziness, focal weakness, weakness and headaches.  Psychiatric/Behavioral:  The patient is nervous/anxious.     As per HPI. Otherwise, a complete review of systems is negative.  PAST MEDICAL HISTORY: Past Medical History:  Diagnosis Date   Hypercholesteremia     PAST SURGICAL HISTORY: Past Surgical History:  Procedure Laterality Date   BREAST BIOPSY Left 06/02/2022   [place] Core Bx, Ribbon clip- path pending   COLONOSCOPY WITH PROPOFOL N/A 02/27/2016   Procedure: COLONOSCOPY WITH PROPOFOL;  Surgeon: Josefine Class, MD;  Location: [facility] END[facility]COPY;  Service:  Endoscopy;  Laterality: N/A;    FAMILY HISTORY: Family History  Problem Relation Age of Onset   Esophageal cancer Son    Pancreatic cancer Son     ADVANCED DIRECTIVES (Y/N):  N  HEALTH MAINTENANCE: Social History   Tobacco Use   Smoking status: Former    Types: Cigarettes    Quit date: 1960    Years since quitting: 63.6   Smokeless tobacco: Never  Substance Use Topics   Alcohol use: Yes    Alcohol/week: 7.0 standard drinks of alcohol    Types: 7 Shots of liquor per week    Comment: 7 drinks per week   Drug use: Never     Colonoscopy:  PAP:  Bone density:  Lipid panel:  No Known Allergies  Current Outpatient Medications  Medication Sig Dispense Refill   aspirin 325 MG tablet Take 325 mg by mouth every 6 (six) hours as needed.     atorvastatin (LIPITOR) 10 MG tablet Take 10 mg by mouth daily.     atorvastatin (LIPITOR) 10 MG tablet Take 1 tablet by mouth daily.     Cholecalciferol (VITAMIN D-1000 MAX ST) 25 MCG (1000 UT) tablet Take by mouth.     cyanocobalamin (VITAMIN B12) 1000 MCG tablet Take by mouth.     ipratropium (ATROVENT) 0.03 % nasal spray Place into both nostrils.     neomycin-polymyxin b-dexamethasone (MAXITROL) 3.5-10000-0.1 OINT APPLY A SMALL AMOUNT ON THE EYELID TWICE DAILY     No current facility-administered medications for this visit.    OBJECTIVE: Vitals:   06/08/22 1100  BP: 138/83  Pulse: 70  Resp: 18  Temp: (!) 96.4 F (35.8 C)  SpO2: 98%  Body mass index is 28.51 kg/m.    ECOG FS:0 - Asymptomatic  General: Well-developed, well-nourished, no acute distress. Eyes: Pink conjunctiva, anicteric sclera. HEENT: Normocephalic, moist mucous membranes. Breast: Easily palpable left breast mass. Lungs: No audible wheezing or coughing. Heart: Regular rate and rhythm. Abdomen: Soft, nontender, no obvious distention. Musculoskeletal: No edema, cyanosis, or clubbing. Neuro: Alert, answering all questions appropriately. Cranial nerves  grossly intact. Skin: No rashes or petechiae noted. Psych: Normal affect. Lymphatics: No cervical, calvicular, axillary or inguinal LAD.   LAB RESULTS:  Lab Results  Component Value Date   NA 141 12/13/2018   K 3.4 (L) 12/13/2018   CL 105 12/13/2018   CO2 28 12/13/2018   GLUCOSE 113 (H) 12/13/2018   BUN 23 12/13/2018   CREATININE 1.29 (H) 12/13/2018   CALCIUM 9.0 12/13/2018   PROT 7.2 12/13/2018   ALBUMIN 4.5 12/13/2018   AST 66 (H) 12/13/2018   ALT 39 12/13/2018   ALKPHOS 68 12/13/2018   BILITOT 1.3 (H) 12/13/2018   GFRNONAA 51 (L) 12/13/2018   GFRAA 59 (L) 12/13/2018    Lab Results  Component Value Date   WBC 8.4 12/13/2018   NEUTROABS 4.5 12/13/2018   HGB 16.1 12/13/2018   HCT 48.1 12/13/2018   MCV 92.3 12/13/2018   PLT 228 12/13/2018     STUDIES: [place] LT BREAST BX W LOC DEV 1ST LESION IMG BX SPEC US GUIDE  Addendum Date: 06/03/2022   ADDENDUM REPORT: 06/03/2022 15:12 ADDENDUM: PATHOLOGY revealed: A. BREAST MASS, LEFT 6:00 RETROAREOLAR; ULTRASOUND-GUIDED BIOPSY: - INVASIVE MAMMARY CARCINOMA, NO SPECIAL TYPE. Size of invasive carcinoma: 14 mm in this sample. Grade 3. Ductal carcinoma in situ: Not identified. Lymphovascular invasion: Not identified. Pathology results are CONCORDANT with imaging findings, per Dr. Valentino Saxon. Pathology results and recommendations were discussed with patient via telephone on 06/03/2022. Patient reported biopsy site doing well with no adverse symptoms, and only slight tenderness at the site. Post biopsy care instructions were reviewed, questions were answered and my direct phone number was provided. Patient was instructed to call [facility] for any additional questions or concerns related to biopsy site. RECOMMENDATIONS: Surgical and oncological consultation. Request for surgical and oncological consultation relayed to Casper Harrison RN at [facility] by Electa Sniff RN on 06/03/2022. Pathology results reported by Electa Sniff RN on 06/03/2022. Electronically Signed   By: Valentino Saxon M.D.   On: 06/03/2022 15:12   Result Date: 06/03/2022 CLINICAL DATA:  LEFT breast mass EXAM: ULTRASOUND GUIDED LEFT BREAST CORE NEEDLE BIOPSY COMPARISON:  Previous exam(s). PROCEDURE: I met with the patient and we discussed the procedure of ultrasound-guided biopsy, including benefits and alternatives. We discussed the high likelihood of a successful procedure. We discussed the risks of the procedure, including infection, bleeding, tissue injury, clip migration, and inadequate sampling. Informed written consent was given. The usual time-out protocol was performed immediately prior to the procedure. Lesion quadrant: Lower outer quadrant Using sterile technique and 1% lidocaine and 1% lidocaine with epinephrine as local anesthetic, under direct ultrasound visualization, a 14 gauge spring-loaded device was used to perform biopsy of a mass at 6 o'clock in the retroareolar breast using a lateral approach. At the conclusion of the procedure a RIBBON shaped tissue marker clip was deployed into the biopsy cavity. Follow up 2 view mammogram was performed and dictated separately. IMPRESSION: Ultrasound guided biopsy of LEFT breast mass at 6 o'clock. No apparent complications. Electronically Signed: By: Valentino Saxon M.D. On: 06/02/2022 10:13  MM CLIP PLACEMENT LEFT  Result Date: 06/02/2022 CLINICAL DATA:  Status post ultrasound-guided biopsy EXAM: 3D DIAGNOSTIC LEFT MAMMOGRAM POST ULTRASOUND BIOPSY COMPARISON:  Previous exam(s). FINDINGS: 3D Mammographic images were obtained following ultrasound guided biopsy of a mass in the lower retroareolar breast. The RIBBON biopsy marking clip is in expected position at the site of biopsy. IMPRESSION: Appropriate positioning of the RIBBON shaped biopsy marking clip at the site of biopsy in the lower retroareolar breast. Final Assessment: Post Procedure Mammograms for Marker Placement Electronically Signed    By: Valentino Saxon M.D.   On: 06/02/2022 10:12  MM DIAG BREAST TOMO BILATERAL  Result Date: 05/19/2022 CLINICAL DATA:  LEFT breast tenderness with inverted nipple and skin discoloration EXAM: DIGITAL DIAGNOSTIC BILATERAL MAMMOGRAM WITH TOMOSYNTHESIS AND CAD; ULTRASOUND LEFT BREAST LIMITED TECHNIQUE: Bilateral digital diagnostic mammography and breast tomosynthesis was performed. The images were evaluated with computer-aided detection.; Targeted ultrasound examination of the left breast was performed. COMPARISON:  None. ACR Breast Density Category b: There are scattered areas of fibroglandular density. FINDINGS: Spot compression tomosynthesis views were obtained of the site of palpable concern in the LEFT retroareolar breast. There is an irregular mass with associated architectural distortion noted at the site of palpable concern. There is associated nipple retraction. No mammographic evidence of malignancy in the RIGHT breast. There is mild RIGHT gynecomastia. On physical exam, nipple retraction is identified. There is a hard mass appreciated in the retroareolar breast. Targeted ultrasound was performed of the LEFT retroareolar breast. There is an irregular hypoechoic mass with irregular margins extending into the nipple noted at 6 o'clock in the retroareolar breast. It measures 2.5 x 1.6 x 1.3 cm. This corresponds to the site of mammographic concern. Targeted ultrasound was performed of the LEFT axilla. No suspicious LEFT axillary lymph nodes are visualized. IMPRESSION: 1. A 2.5 cm mass in the LEFT retroareolar breast with possible extension into the nipple is suspicious for malignancy. Recommend ultrasound-guided biopsy for definitive characterization. 2. No mammographic evidence of malignancy in the RIGHT breast. There is mild RIGHT-sided gynecomastia. 3. No suspicious LEFT axillary adenopathy. RECOMMENDATION: LEFT breast ultrasound-guided biopsy x1 I have discussed the findings and recommendations with  the patient and patient's wife. The biopsy procedure was discussed with the patient and questions were answered. Patient expressed their understanding of the biopsy recommendation. Patient will be scheduled for biopsy at her earliest convenience by the schedulers. Ordering provider will be notified. If applicable, a reminder letter will be sent to the patient regarding the next appointment. BI-RADS CATEGORY  5: Highly suggestive of malignancy. Electronically Signed   By: Valentino Saxon M.D.   On: 05/19/2022 15:29  US BREAST LTD UNI LEFT INC AXILLA  Result Date: 05/19/2022 CLINICAL DATA:  LEFT breast tenderness with inverted nipple and skin discoloration EXAM: DIGITAL DIAGNOSTIC BILATERAL MAMMOGRAM WITH TOMOSYNTHESIS AND CAD; ULTRASOUND LEFT BREAST LIMITED TECHNIQUE: Bilateral digital diagnostic mammography and breast tomosynthesis was performed. The images were evaluated with computer-aided detection.; Targeted ultrasound examination of the left breast was performed. COMPARISON:  None. ACR Breast Density Category b: There are scattered areas of fibroglandular density. FINDINGS: Spot compression tomosynthesis views were obtained of the site of palpable concern in the LEFT retroareolar breast. There is an irregular mass with associated architectural distortion noted at the site of palpable concern. There is associated nipple retraction. No mammographic evidence of malignancy in the RIGHT breast. There is mild RIGHT gynecomastia. On physical exam, nipple retraction is identified. There is a hard mass appreciated in the retroareolar  breast. Targeted ultrasound was performed of the LEFT retroareolar breast. There is an irregular hypoechoic mass with irregular margins extending into the nipple noted at 6 o'clock in the retroareolar breast. It measures 2.5 x 1.6 x 1.3 cm. This corresponds to the site of mammographic concern. Targeted ultrasound was performed of the LEFT axilla. No suspicious LEFT axillary lymph  nodes are visualized. IMPRESSION: 1. A 2.5 cm mass in the LEFT retroareolar breast with possible extension into the nipple is suspicious for malignancy. Recommend ultrasound-guided biopsy for definitive characterization. 2. No mammographic evidence of malignancy in the RIGHT breast. There is mild RIGHT-sided gynecomastia. 3. No suspicious LEFT axillary adenopathy. RECOMMENDATION: LEFT breast ultrasound-guided biopsy x1 I have discussed the findings and recommendations with the patient and patient's wife. The biopsy procedure was discussed with the patient and questions were answered. Patient expressed their understanding of the biopsy recommendation. Patient will be scheduled for biopsy at her earliest convenience by the schedulers. Ordering provider will be notified. If applicable, a reminder letter will be sent to the patient regarding the next appointment. BI-RADS CATEGORY  5: Highly suggestive of malignancy. Electronically Signed   By: Valentino Saxon M.D.   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  Agree with recommendation to proceed with surgery and sentinel node biopsy which is scheduled for June 17, 2022.  If patient undergoes lumpectomy, he will require adjuvant XRT.  Although patient has stage IIa disease, given his advanced age we will have to further discuss the benefits of chemotherapy.  Finally, given the ER/PR positivity of his tumor he will benefit from tamoxifen for minimum of 5 years.  Return to clinic 2 weeks after his surgery for further evaluation at which time patient will also have consultation with radiation oncology.   Genetics: Patient is given a referral to genetics for further evaluation.  He does not have  a significant family history of cancer, but has had 1 son who is in remission from esophageal cancer and another son who passed away from stage IV pancreatic cancer.    I spent a total of 60 minutes reviewing chart data, face-to-face evaluation with the patient, counseling and coordination of care as detailed above.  Patient expressed understanding and was in agreement with this plan. He also understands that He can call clinic at any time with any questions, concerns, or complaints.    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

## 2022-06-08 NOTE — Progress Notes (Signed)
Accompanied patient and wife during initial med onc visit.   Care plan summary given to the patient.  All questions answered to their satisfaction. Mastectomy scheduled for 06/21/22, will see Dr. Grayland Ormond and Dr. Baruch Gouty 2 weeks after surgery.

## 2022-06-09 ENCOUNTER — Other Ambulatory Visit: Payer: Self-pay | Admitting: General Surgery

## 2022-06-09 DIAGNOSIS — C50022 Malignant neoplasm of nipple and areola, left male breast: Secondary | ICD-10-CM

## 2022-06-15 ENCOUNTER — Encounter
Admission: RE | Admit: 2022-06-15 | Discharge: 2022-06-15 | Disposition: A | Discharge status: Home / Self Care | Payer: Medicare Other | Source: Ambulatory Visit | Attending: General Surgery | Admitting: General Surgery

## 2022-06-15 ENCOUNTER — Other Ambulatory Visit: Payer: Self-pay

## 2022-06-15 HISTORY — DX: Unspecified osteoarthritis, unspecified site: M19.90

## 2022-06-15 HISTORY — DX: Chronic kidney disease, unspecified: N18.9

## 2022-06-15 HISTORY — DX: Unspecified atrial fibrillation: I48.91

## 2022-06-15 HISTORY — DX: Benign neoplasm of colon, unspecified: D12.6

## 2022-06-15 HISTORY — DX: Polyneuropathy, unspecified: G62.9

## 2022-06-15 HISTORY — DX: Spinal stenosis, lumbar region without neurogenic claudication: M48.061

## 2022-06-15 HISTORY — DX: Sciatica, right side: M54.31

## 2022-06-15 HISTORY — DX: Polyp of colon: K63.5

## 2022-06-15 HISTORY — DX: Cortical age-related cataract, unspecified eye: H25.019

## 2022-06-15 NOTE — Patient Instructions (Addendum)
Your procedure is scheduled on: June 21, 2021 MONDAY Report to the Registration Desk on the 1st floor of the [facility] AT 8:30am   REMEMBER: Instructions that are not followed completely may result in serious medical risk, up to and including death; or upon the discretion of your surgeon and anesthesiologist your surgery may need to be rescheduled.  Do not eat food OR DRINK ANY LIQUIDS after midnight the night before surgery.  No gum chewing, lozengers or hard candies.  TAKE THESE MEDICATIONS THE MORNING OF SURGERY WITH A SIP OF WATER: Atovastatin  Follow recommendations from Cardiologist, Pulmonologist or PCP regarding stopping Aspirin, Coumadin, Plavix, Eliquis, Pradaxa, or Pletal. STOP ASPIRIN 5 DAYS BEFORE SURGERY - LAST DOSE June 16, 2021 PER DR CINTRON-DIAZ  One week prior to surgery: starting now Stop Anti-inflammatories (NSAIDS) such as Advil, Aleve, Ibuprofen, Motrin, Naproxen, Naprosyn and Aspirin based products such as Excedrin, Goodys Powder, BC Powder. Stop ANY OVER THE COUNTER supplements until after surgery. You may however, continue to take Tylenol if needed for pain up until the day of surgery.  No Alcohol for 24 hours before or after surgery.  No Smoking including e-cigarettes for 24 hours prior to surgery.  No chewable tobacco products for at least 6 hours prior to surgery.  No nicotine patches on the day of surgery.  Do not use any "recreational" drugs for at least a week prior to your surgery.  Please be advised that the combination of cocaine and anesthesia may have negative outcomes, up to and including death. If you test positive for cocaine, your surgery will be cancelled.  On the morning of surgery brush your teeth with toothpaste and water, you may rinse your mouth with mouthwash if you wish. Do not swallow any toothpaste or mouthwash.  Use CHG Soap as directed on instruction sheet.  Do not wear jewelry, make-up, hairpins, clips or nail  polish.  Do not wear lotions, powders, or perfumes or deodorant.   Do not shave body from the neck down 48 hours prior to surgery just in case you cut yourself which could leave a site for infection.  Also, freshly shaved skin may become irritated if using the CHG soap.  Contact lenses, hearing aids and dentures may not be worn into surgery.  Do not bring valuables to the hospital. [facility] is not responsible for any missing/lost belongings or valuables.   Notify your doctor if there is any change in your medical condition (cold, fever, infection).  Wear comfortable clothing (specific to your surgery type) to the hospital.  After surgery, you can help prevent lung complications by doing breathing exercises.  Take deep breaths and cough every 1-2 hours. Your doctor may order a device called an Incentive Spirometer to help you take deep breaths. When coughing or sneezing, hold a pillow firmly against your incision with both hands. This is called "splinting." Doing this helps protect your incision. It also decreases belly discomfort.  If you are being admitted to the hospital overnight, leave your suitcase in the car. After surgery it may be brought to your room.  If you are being discharged the day of surgery, you will not be allowed to drive home. You will need a responsible adult (18 years or older) to drive you home and stay with you that night.   If you are taking public transportation, you will need to have a responsible adult (18 years or older) with you. Please confirm with your physician that it is acceptable to  use public transportation.   Please call the [facility] at [phone] if you have any questions about these instructions.  Surgery Visitation Policy:  Patients undergoing a surgery or procedure may have two family members or support persons with them as long as the person is not COVID-19 positive or experiencing its symptoms.   Inpatient  Visitation:    Visiting hours are 7 a.m. to 8 p.m. Up to four visitors are allowed at one time in a patient room, including children. The visitors may rotate out with other people during the day. One designated support person (adult) may remain overnight.

## 2022-06-16 ENCOUNTER — Inpatient Hospital Stay (HOSPITAL_BASED_OUTPATIENT_CLINIC_OR_DEPARTMENT_OTHER): Payer: Medicare Other | Admitting: Hospice and Palliative Medicine

## 2022-06-16 ENCOUNTER — Encounter
Admission: RE | Admit: 2022-06-16 | Discharge: 2022-06-16 | Disposition: A | Discharge status: Home / Self Care | Payer: Medicare Other | Source: Ambulatory Visit | Attending: General Surgery | Admitting: General Surgery

## 2022-06-16 DIAGNOSIS — C50922 Malignant neoplasm of unspecified site of left male breast: Secondary | ICD-10-CM | POA: Insufficient documentation

## 2022-06-16 DIAGNOSIS — N183 Chronic kidney disease, stage 3 unspecified: Secondary | ICD-10-CM | POA: Diagnosis not present

## 2022-06-16 DIAGNOSIS — Z01812 Encounter for preprocedural laboratory examination: Secondary | ICD-10-CM | POA: Insufficient documentation

## 2022-06-16 DIAGNOSIS — E785 Hyperlipidemia, unspecified: Secondary | ICD-10-CM | POA: Insufficient documentation

## 2022-06-16 LAB — BASIC METABOLIC PANEL
Anion gap: 9 (ref 5–15)
BUN: 17 mg/dL (ref 8–23)
CO2: 22 mmol/L (ref 22–32)
Calcium: 8.9 mg/dL (ref 8.9–10.3)
Chloride: 107 mmol/L (ref 98–111)
Creatinine, Ser: 1.19 mg/dL (ref 0.61–1.24)
GFR, Estimated: 60 mL/min — ABNORMAL LOW (ref >60)
Glucose, Bld: 132 mg/dL — ABNORMAL HIGH (ref 70–99)
Potassium: 3.6 mmol/L (ref 3.5–5.1)
Sodium: 138 mmol/L (ref 135–145)

## 2022-06-16 LAB — CBC
HCT: 45 % (ref 39.0–52.0)
Hemoglobin: 15.9 g/dL (ref 13.0–17.0)
MCH: 32 pg (ref 26.0–34.0)
MCHC: 35.3 g/dL (ref 30.0–36.0)
MCV: 90.5 fL (ref 80.0–100.0)
Platelets: 238 K/uL (ref 150–400)
RBC: 4.97 MIL/uL (ref 4.22–5.81)
RDW: 13.7 % (ref 11.5–15.5)
WBC: 11.8 K/uL — ABNORMAL HIGH (ref 4.0–10.5)
nRBC: 0 % (ref 0.0–0.2)

## 2022-06-16 NOTE — Progress Notes (Signed)
Multidisciplinary Oncology Council Documentation  Tag Wurtz was presented by our [facility] on 06/16/2022, which included representatives from:  Palliative Care Dietitian  Physical/Occupational Therapist Nurse Navigator Genetics Speech Therapist Social work Survivorship RN Financial Navigator Research RN   Dailen currently presents with history of breast cancer  We reviewed previous medical and familial history, history of present illness, and recent lab results along with all available histopathologic and imaging studies. The [place] considered available treatment options and made the following recommendations/referrals:  Rehab screening  The MOC is a meeting of clinicians from various specialty areas who evaluate and discuss patients for whom a multidisciplinary approach is being considered. Final determinations in the plan of care are those of the provider(s).   Today's extended care, comprehensive team conference, Phuc was not present for the discussion and was not examined.

## 2022-06-17 ENCOUNTER — Telehealth: Payer: Self-pay | Admitting: *Deleted

## 2022-06-17 NOTE — Telephone Encounter (Signed)
Received referral from [facility] for Rehab screening

## 2022-06-20 MED ORDER — CHLORHEXIDINE GLUCONATE 0.12 % MT SOLN: 15.0000 mL | Freq: Once | OROMUCOSAL | Status: AC

## 2022-06-20 MED ORDER — FAMOTIDINE 20 MG PO TABS: 20.0000 mg | ORAL_TABLET | Freq: Once | ORAL | Status: AC

## 2022-06-20 MED ORDER — ORAL CARE MOUTH RINSE: 15.0000 mL | Freq: Once | OROMUCOSAL | Status: AC

## 2022-06-20 MED ORDER — CEFAZOLIN SODIUM-DEXTROSE 2-4 GM/100ML-% IV SOLN
2.0000 g | INTRAVENOUS | Status: AC
  Administered 2022-06-21: 2 g via INTRAVENOUS

## 2022-06-20 MED ORDER — LACTATED RINGERS IV SOLN: INTRAVENOUS | Status: DC

## 2022-06-21 ENCOUNTER — Ambulatory Visit
Admission: RE | Admit: 2022-06-21 | Discharge: 2022-06-21 | Disposition: A | Discharge status: Home / Self Care | Payer: Medicare Other | Attending: General Surgery | Admitting: General Surgery

## 2022-06-21 ENCOUNTER — Inpatient Hospital Stay: Payer: Medicare Other | Admitting: Urgent Care

## 2022-06-21 ENCOUNTER — Other Ambulatory Visit: Payer: Self-pay

## 2022-06-21 ENCOUNTER — Encounter
Admission: RE | Disposition: A | Discharge status: Home / Self Care | Payer: Self-pay | Source: Home / Self Care | Attending: General Surgery

## 2022-06-21 ENCOUNTER — Ambulatory Visit
Admission: RE | Admit: 2022-06-21 | Discharge: 2022-06-21 | Disposition: A | Discharge status: Home / Self Care | Payer: Medicare Other | Source: Ambulatory Visit | Attending: General Surgery | Admitting: General Surgery

## 2022-06-21 ENCOUNTER — Inpatient Hospital Stay: Payer: Medicare Other | Admitting: Anesthesiology

## 2022-06-21 ENCOUNTER — Other Ambulatory Visit: Payer: Medicare Other

## 2022-06-21 ENCOUNTER — Encounter: Payer: Self-pay | Admitting: General Surgery

## 2022-06-21 DIAGNOSIS — K219 Gastro-esophageal reflux disease without esophagitis: Secondary | ICD-10-CM | POA: Insufficient documentation

## 2022-06-21 DIAGNOSIS — G709 Myoneural disorder, unspecified: Secondary | ICD-10-CM | POA: Insufficient documentation

## 2022-06-21 DIAGNOSIS — I480 Paroxysmal atrial fibrillation: Secondary | ICD-10-CM | POA: Insufficient documentation

## 2022-06-21 DIAGNOSIS — Z87891 Personal history of nicotine dependence: Secondary | ICD-10-CM | POA: Diagnosis not present

## 2022-06-21 DIAGNOSIS — N183 Chronic kidney disease, stage 3 unspecified: Secondary | ICD-10-CM | POA: Insufficient documentation

## 2022-06-21 DIAGNOSIS — M1990 Unspecified osteoarthritis, unspecified site: Secondary | ICD-10-CM | POA: Diagnosis not present

## 2022-06-21 DIAGNOSIS — D0512 Intraductal carcinoma in situ of left breast: Secondary | ICD-10-CM | POA: Diagnosis present

## 2022-06-21 DIAGNOSIS — Z85038 Personal history of other malignant neoplasm of large intestine: Secondary | ICD-10-CM | POA: Insufficient documentation

## 2022-06-21 DIAGNOSIS — E785 Hyperlipidemia, unspecified: Secondary | ICD-10-CM

## 2022-06-21 DIAGNOSIS — C50022 Malignant neoplasm of nipple and areola, left male breast: Secondary | ICD-10-CM

## 2022-06-21 DIAGNOSIS — C50922 Malignant neoplasm of unspecified site of left male breast: Secondary | ICD-10-CM

## 2022-06-21 DIAGNOSIS — Z01812 Encounter for preprocedural laboratory examination: Secondary | ICD-10-CM

## 2022-06-21 HISTORY — PX: SIMPLE MASTECTOMY WITH AXILLARY SENTINEL NODE BIOPSY: SHX6098

## 2022-06-21 SURGERY — SIMPLE MASTECTOMY WITH AXILLARY SENTINEL NODE BIOPSY
Anesthesia: General | Site: Breast | Laterality: Left

## 2022-06-21 MED ORDER — OXYCODONE HCL 5 MG/5ML PO SOLN: 5.0000 mg | Freq: Once | ORAL | Status: AC | PRN

## 2022-06-21 MED ORDER — EPHEDRINE SULFATE (PRESSORS) 50 MG/ML IJ SOLN
INTRAMUSCULAR | Status: DC | PRN
  Administered 2022-06-21: 5 mg via INTRAVENOUS

## 2022-06-21 MED ORDER — LACTATED RINGERS IV SOLN: INTRAVENOUS | Status: DC

## 2022-06-21 MED ORDER — CEFAZOLIN SODIUM-DEXTROSE 2-4 GM/100ML-% IV SOLN
INTRAVENOUS | Status: AC
  Filled 2022-06-21: qty 100

## 2022-06-21 MED ORDER — FENTANYL CITRATE (PF) 100 MCG/2ML IJ SOLN: 25.0000 ug | INTRAMUSCULAR | Status: DC | PRN

## 2022-06-21 MED ORDER — LACTATED RINGERS IV SOLN: INTRAVENOUS | Status: DC | PRN

## 2022-06-21 MED ORDER — PROPOFOL 10 MG/ML IV BOLUS
INTRAVENOUS | Status: AC
  Filled 2022-06-21: qty 20

## 2022-06-21 MED ORDER — ACETAMINOPHEN 10 MG/ML IV SOLN
INTRAVENOUS | Status: DC | PRN
  Administered 2022-06-21: 1000 mg via INTRAVENOUS

## 2022-06-21 MED ORDER — FENTANYL CITRATE (PF) 100 MCG/2ML IJ SOLN
INTRAMUSCULAR | Status: DC | PRN
Start: 2022-06-21 — End: 2022-06-21
  Administered 2022-06-21 (×2): 25 ug via INTRAVENOUS
  Administered 2022-06-21: 50 ug via INTRAVENOUS
  Administered 2022-06-21 (×4): 25 ug via INTRAVENOUS

## 2022-06-21 MED ORDER — HYDROCODONE-ACETAMINOPHEN 5-325 MG PO TABS
1.0000 | ORAL_TABLET | ORAL | 0 refills | Status: AC | PRN
Start: 2022-06-21 — End: 2022-06-24

## 2022-06-21 MED ORDER — FENTANYL CITRATE (PF) 100 MCG/2ML IJ SOLN
INTRAMUSCULAR | Status: AC
  Filled 2022-06-21: qty 2

## 2022-06-21 MED ORDER — EPHEDRINE 5 MG/ML INJ
INTRAVENOUS | Status: AC
  Filled 2022-06-21: qty 5

## 2022-06-21 MED ORDER — ONDANSETRON HCL 4 MG/2ML IJ SOLN
INTRAMUSCULAR | Status: AC
  Filled 2022-06-21: qty 2

## 2022-06-21 MED ORDER — EPHEDRINE SULFATE (PRESSORS) 50 MG/ML IJ SOLN
10.0000 mg | Freq: Once | INTRAMUSCULAR | Status: AC
  Administered 2022-06-21: 10 mg via INTRAVENOUS

## 2022-06-21 MED ORDER — FAMOTIDINE 20 MG PO TABS
ORAL_TABLET | ORAL | Status: AC
  Administered 2022-06-21: 20 mg via ORAL
  Filled 2022-06-21: qty 1

## 2022-06-21 MED ORDER — TECHNETIUM TC 99M TILMANOCEPT KIT
1.0000 | PACK | Freq: Once | INTRAVENOUS | Status: AC | PRN
  Administered 2022-06-21: 1.05 via INTRADERMAL

## 2022-06-21 MED ORDER — PROPOFOL 10 MG/ML IV BOLUS
INTRAVENOUS | Status: DC | PRN
  Administered 2022-06-21: 150 mg via INTRAVENOUS

## 2022-06-21 MED ORDER — ACETAMINOPHEN 10 MG/ML IV SOLN
INTRAVENOUS | Status: AC
  Filled 2022-06-21: qty 100

## 2022-06-21 MED ORDER — PHENYLEPHRINE 80 MCG/ML (10ML) SYRINGE FOR IV PUSH (FOR BLOOD PRESSURE SUPPORT)
PREFILLED_SYRINGE | INTRAVENOUS | Status: DC | PRN
  Administered 2022-06-21 (×2): 120 ug via INTRAVENOUS

## 2022-06-21 MED ORDER — ONDANSETRON HCL 4 MG/2ML IJ SOLN: 4.0000 mg | Freq: Once | INTRAMUSCULAR | Status: DC | PRN

## 2022-06-21 MED ORDER — DEXAMETHASONE SODIUM PHOSPHATE 10 MG/ML IJ SOLN
INTRAMUSCULAR | Status: DC | PRN
  Administered 2022-06-21: 10 mg via INTRAVENOUS

## 2022-06-21 MED ORDER — ONDANSETRON HCL 4 MG/2ML IJ SOLN
INTRAMUSCULAR | Status: DC | PRN
  Administered 2022-06-21: 4 mg via INTRAVENOUS

## 2022-06-21 MED ORDER — EPHEDRINE SULFATE (PRESSORS) 50 MG/ML IJ SOLN
INTRAMUSCULAR | Status: AC
  Filled 2022-06-21: qty 1

## 2022-06-21 MED ORDER — BUPIVACAINE-EPINEPHRINE (PF) 0.5% -1:200000 IJ SOLN
INTRAMUSCULAR | Status: AC
  Filled 2022-06-21: qty 30

## 2022-06-21 MED ORDER — BUPIVACAINE LIPOSOME 1.3 % IJ SUSP
INTRAMUSCULAR | Status: AC
  Filled 2022-06-21: qty 20

## 2022-06-21 MED ORDER — PHENYLEPHRINE HCL-NACL 20-0.9 MG/250ML-% IV SOLN
INTRAVENOUS | Status: AC
  Filled 2022-06-21: qty 250

## 2022-06-21 MED ORDER — PHENYLEPHRINE HCL-NACL 20-0.9 MG/250ML-% IV SOLN
INTRAVENOUS | Status: DC | PRN
  Administered 2022-06-21: 35 ug/min via INTRAVENOUS

## 2022-06-21 MED ORDER — OXYCODONE HCL 5 MG PO TABS
ORAL_TABLET | ORAL | Status: AC
  Administered 2022-06-21: 5 mg via ORAL
  Filled 2022-06-21: qty 1

## 2022-06-21 MED ORDER — DEXMEDETOMIDINE (PRECEDEX) IN NS 20 MCG/5ML (4 MCG/ML) IV SYRINGE
PREFILLED_SYRINGE | INTRAVENOUS | Status: DC | PRN
  Administered 2022-06-21 (×2): 4 ug via INTRAVENOUS
  Administered 2022-06-21: 8 ug via INTRAVENOUS

## 2022-06-21 MED ORDER — SODIUM CHLORIDE (PF) 0.9 % IJ SOLN
INTRAMUSCULAR | Status: AC
  Filled 2022-06-21: qty 50

## 2022-06-21 MED ORDER — METHOCARBAMOL 500 MG PO TABS
500.0000 mg | ORAL_TABLET | Freq: Three times a day (TID) | ORAL | 0 refills | Status: AC | PRN
Start: 2022-06-21 — End: 2022-06-26

## 2022-06-21 MED ORDER — ACETAMINOPHEN 10 MG/ML IV SOLN: 1000.0000 mg | Freq: Once | INTRAVENOUS | Status: DC | PRN

## 2022-06-21 MED ORDER — CHLORHEXIDINE GLUCONATE 0.12 % MT SOLN
OROMUCOSAL | Status: AC
  Administered 2022-06-21: 15 mL via OROMUCOSAL
  Filled 2022-06-21: qty 15

## 2022-06-21 MED ORDER — DEXAMETHASONE SODIUM PHOSPHATE 10 MG/ML IJ SOLN
INTRAMUSCULAR | Status: AC
  Filled 2022-06-21: qty 1

## 2022-06-21 MED ORDER — OXYCODONE HCL 5 MG PO TABS: 5.0000 mg | ORAL_TABLET | Freq: Once | ORAL | Status: AC | PRN

## 2022-06-21 MED ORDER — LIDOCAINE HCL (CARDIAC) PF 100 MG/5ML IV SOSY
PREFILLED_SYRINGE | INTRAVENOUS | Status: DC | PRN
  Administered 2022-06-21: 80 mg via INTRAVENOUS

## 2022-06-21 MED ORDER — LIDOCAINE HCL (PF) 2 % IJ SOLN
INTRAMUSCULAR | Status: AC
  Filled 2022-06-21: qty 5

## 2022-06-21 MED ORDER — BUPIVACAINE-EPINEPHRINE (PF) 0.5% -1:200000 IJ SOLN
INTRAMUSCULAR | Status: DC | PRN
  Administered 2022-06-21: 50 mL

## 2022-06-21 SURGICAL SUPPLY — 55 items
ADH SKN CLS APL DERMABOND .7 (GAUZE/BANDAGES/DRESSINGS) ×2
APL PRP STRL LF DISP 70% ISPRP (MISCELLANEOUS) ×1
APPLIER CLIP 11 MED OPEN (CLIP) ×1
APR CLP MED 11 20 MLT OPN (CLIP) ×1
BINDER BREAST LRG (GAUZE/BANDAGES/DRESSINGS) IMPLANT
BLADE CLIPPER SURG (BLADE) IMPLANT
BLADE SURG 15 STRL LF DISP TIS (BLADE) ×1 IMPLANT
BLADE SURG 15 STRL SS (BLADE) ×1
BULB RESERV EVAC DRAIN JP 100C (MISCELLANEOUS) ×1 IMPLANT
CHLORAPREP W/TINT 26 (MISCELLANEOUS) ×1 IMPLANT
CLIP APPLIE 11 MED OPEN (CLIP) IMPLANT
CNTNR SPEC 2.5X3XGRAD LEK (MISCELLANEOUS) ×1
CONT SPEC 4OZ STER OR WHT (MISCELLANEOUS) ×1
CONT SPEC 4OZ STRL OR WHT (MISCELLANEOUS) ×1
CONTAINER SPEC 2.5X3XGRAD LEK (MISCELLANEOUS) ×1 IMPLANT
COVER PROBE GAMMA FINDER SLV (MISCELLANEOUS) ×1 IMPLANT
DERMABOND ADVANCED (GAUZE/BANDAGES/DRESSINGS) ×2
DERMABOND ADVANCED .7 DNX12 (GAUZE/BANDAGES/DRESSINGS) ×2 IMPLANT
DEVICE DUBIN SPECIMEN MAMMOGRA (MISCELLANEOUS) ×1 IMPLANT
DRAIN CHANNEL JP 15F RND 16 (MISCELLANEOUS) IMPLANT
DRAPE LAPAROTOMY TRNSV 106X77 (MISCELLANEOUS) ×1 IMPLANT
DRSG GAUZE FLUFF 36X18 (GAUZE/BANDAGES/DRESSINGS) ×1 IMPLANT
ELECT REM PT RETURN 9FT ADLT (ELECTROSURGICAL) ×1
ELECTRODE REM PT RTRN 9FT ADLT (ELECTROSURGICAL) ×1 IMPLANT
GAUZE 4X4 16PLY ~~LOC~~+RFID DBL (SPONGE) ×1 IMPLANT
GAUZE SPONGE 4X4 12PLY STRL (GAUZE/BANDAGES/DRESSINGS) ×1 IMPLANT
GLOVE BIO SURGEON STRL SZ 6.5 (GLOVE) ×2 IMPLANT
GLOVE BIOGEL PI IND STRL 6.5 (GLOVE) ×1 IMPLANT
GLOVE BIOGEL PI INDICATOR 6.5 (GLOVE) ×1
GOWN STRL REUS W/ TWL LRG LVL3 (GOWN DISPOSABLE) ×2 IMPLANT
GOWN STRL REUS W/TWL LRG LVL3 (GOWN DISPOSABLE) ×2
JACKSON PRATT 10 (INSTRUMENTS) ×1 IMPLANT
KIT TURNOVER KIT A (KITS) ×1 IMPLANT
LABEL OR SOLS (LABEL) ×1 IMPLANT
MANIFOLD NEPTUNE II (INSTRUMENTS) ×1 IMPLANT
NEEDLE HYPO 22GX1.5 SAFETY (NEEDLE) ×1 IMPLANT
PACK BASIN MAJOR ARMC (MISCELLANEOUS) ×1 IMPLANT
SPONGE DRAIN TRACH 4X4 STRL 2S (GAUZE/BANDAGES/DRESSINGS) IMPLANT
SPONGE T-LAP 18X18 ~~LOC~~+RFID (SPONGE) ×2 IMPLANT
SUT ETHILON 3-0 FS-10 30 BLK (SUTURE) ×1
SUT MNCRL 4-0 (SUTURE) ×3
SUT MNCRL 4-0 27XMFL (SUTURE) ×3
SUT SILK 2 0 SH (SUTURE) ×1 IMPLANT
SUT SILK 3-0 (SUTURE) ×1 IMPLANT
SUT VIC AB 3-0 54X BRD REEL (SUTURE) ×1 IMPLANT
SUT VIC AB 3-0 BRD 54 (SUTURE) ×1
SUT VIC AB 3-0 SH 27 (SUTURE)
SUT VIC AB 3-0 SH 27X BRD (SUTURE) IMPLANT
SUT VIC AB 3-0 SH 8-18 (SUTURE) ×1 IMPLANT
SUTURE EHLN 3-0 FS-10 30 BLK (SUTURE) ×1 IMPLANT
SUTURE MNCRL 4-0 27XMF (SUTURE) ×2 IMPLANT
SYR BULB IRRIG 60ML STRL (SYRINGE) ×1 IMPLANT
TRAP FLUID SMOKE EVACUATOR (MISCELLANEOUS) ×1 IMPLANT
WATER STERILE IRR 1000ML POUR (IV SOLUTION) ×1 IMPLANT
WATER STERILE IRR 500ML POUR (IV SOLUTION) ×1 IMPLANT

## 2022-06-21 NOTE — Anesthesia Postprocedure Evaluation (Signed)
Anesthesia Post Note  Patient: Seyed Heffley  Procedure(s) Performed: SIMPLE MASTECTOMY WITH AXILLARY SENTINEL NODE BIOPSY (Left: Breast)  Patient location during evaluation: PACU Anesthesia Type: General Level of consciousness: awake and alert, oriented and patient cooperative Pain management: pain level controlled Vital Signs Assessment: post-procedure vital signs reviewed and stable Respiratory status: spontaneous breathing, nonlabored ventilation and respiratory function stable Cardiovascular status: blood pressure returned to baseline and stable Postop Assessment: adequate PO intake Anesthetic complications: no   No notable events documented.   Last Vitals:  Vitals:   06/21/22 1215 06/21/22 1221  BP: 117/79   Pulse: 93 92  Resp: (!) 21 (!) 23  Temp:    SpO2: 90% (!) 89%    Last Pain:  Vitals:   06/21/22 1221  TempSrc:   PainSc: [place]

## 2022-06-21 NOTE — Anesthesia Preprocedure Evaluation (Addendum)
Anesthesia Evaluation  Patient identified by MRN, date of birth, ID band Patient awake    Reviewed: Allergy & Precautions, NPO status , Patient's Chart, lab work & pertinent test results  History of Anesthesia Complications Negative for: history of anesthetic complications  Airway Mallampati: I   Neck ROM: Full    Dental  (+) Missing   Pulmonary former smoker (quit 1960),    Pulmonary exam normal breath sounds clear to auscultation       Cardiovascular Normal cardiovascular exam+ dysrhythmias (a fib)  Rhythm:Regular Rate:Normal  ECG 02/19/22: normal   Neuro/Psych  Neuromuscular disease (neuropathy)    GI/Hepatic GERD  ,  Endo/Other  negative endocrine ROS  Renal/GU Renal disease (stage III CKD)     Musculoskeletal  (+) Arthritis ,   Abdominal   Peds  Hematology negative hematology ROS (+)   Anesthesia Other Findings   Reproductive/Obstetrics                            Anesthesia Physical Anesthesia Plan  ASA: 2  Anesthesia Plan: General   Post-op Pain Management:    Induction: Intravenous  PONV Risk Score and Plan: 2 and Ondansetron, Dexamethasone and Treatment may vary due to age or medical condition  Airway Management Planned: LMA  Additional Equipment:   Intra-op Plan:   Post-operative Plan: Extubation in OR  Informed Consent: I have reviewed the patients History and Physical, chart, labs and discussed the procedure including the risks, benefits and alternatives for the proposed anesthesia with the patient or authorized representative who has indicated his/her understanding and acceptance.     Dental advisory given  Plan Discussed with: CRNA  Anesthesia Plan Comments: (Patient consented for risks of anesthesia including but not limited to:  - adverse reactions to medications - damage to eyes, teeth, lips or other oral mucosa - nerve damage due to positioning  -  sore throat or hoarseness - damage to heart, brain, nerves, lungs, other parts of body or loss of life  Informed patient about role of CRNA in peri- and intra-operative care.  Patient voiced understanding.)        Anesthesia Quick Evaluation

## 2022-06-21 NOTE — Transfer of Care (Signed)
Immediate Anesthesia Transfer of Care Note  Patient: Gerald Hunter  Procedure(s) Performed: SIMPLE MASTECTOMY WITH AXILLARY SENTINEL NODE BIOPSY (Left: Breast)  Patient Location: PACU  Anesthesia Type:General  Level of Consciousness: drowsy  Airway & Oxygen Therapy: Patient Spontanous Breathing and Patient connected to face mask oxygen  Post-op Assessment: Report given to RN and Post -op Vital signs reviewed and stable  Post vital signs: Reviewed and stable  Last Vitals:  Vitals Value Taken Time  BP 119/72 06/21/22 1138  Temp    Pulse 77 06/21/22 1141  Resp 12 06/21/22 1141  SpO2 99 % 06/21/22 1141  Vitals shown include unvalidated device data.  Last Pain:  Vitals:   06/21/22 0853  TempSrc: Oral  PainSc: 0-No pain         Complications: No notable events documented.

## 2022-06-21 NOTE — OR Nursing (Signed)
Dr. Windell Moment in to see pt in postop 110 pm.

## 2022-06-21 NOTE — Discharge Instructions (Addendum)
  Diet: Resume home heart healthy regular diet.   Activity: No heavy lifting >20 pounds (children, pets, laundry, garbage) or strenuous activity until follow-up, but light activity and walking are encouraged. Do not drive or drink alcohol if taking narcotic pain medications.  Wound care: May shower with soapy water and pat dry (do not rub incisions), but no baths or submerging incision underwater until follow-up. (no swimming)   Try to keep binder in place most of the time. May take it off to shower and rest a little bit.   Empty drain and chart output as instructed. This could be done one or two times per day.   Medications: Resume all home medications. For mild to moderate pain: acetaminophen (Tylenol) or ibuprofen (if no kidney disease). Combining Tylenol with alcohol can substantially increase your risk of causing liver disease. Narcotic pain medications, if prescribed, can be used for severe pain, though may cause nausea, constipation, and drowsiness. Do not combine Tylenol and Norco within a 6 hour period as Norco contains Tylenol. If you do not need the narcotic pain medication, you do not need to fill the prescription.  Be aware of constipation. If develops constipation may use Miralax up to twice per day.  Call office [phone]) at any time if any questions, worsening pain, fevers/chills, bleeding, drainage from incision site, or other concerns.   AMBULATORY SURGERY  DISCHARGE INSTRUCTIONS   The drugs that you were given will stay in your system until tomorrow so for the next 24 hours you should not:  Drive an automobile Make any legal decisions Drink any alcoholic beverage   You may resume regular meals tomorrow.  Today it is better to start with liquids and gradually work up to solid foods.  You may eat anything you prefer, but it is better to start with liquids, then soup and crackers, and gradually work up to solid foods.   Please notify your doctor immediately if  you have any unusual bleeding, trouble breathing, redness and pain at the surgery site, drainage, fever, or pain not relieved by medication.    Additional Instructions:        Please contact your physician with any problems or Same Day Surgery at [phone], Monday through Friday 6 am to 4 pm, or [place] at [facility] number at [phone].

## 2022-06-21 NOTE — Op Note (Signed)
Preoperative diagnosis: Invasive mammary Carcinoma of the left breast.  Postoperative diagnosis: Same.   Procedure: Left total mastectomy.                     Sentinel Lymph node biopsy   Anesthesia: GETA  Surgeon: Dr. Windell Moment  Wound Classification: Clean  Indications: Patient is a 88 y.o. male who had an abnormal mammogram that on workup with core needle biopsy was found to be invasive ductal carcinoma. After discussion of alternatives, the patient elected total (simple) mastectomy.  Findings: Palpable mass of the nipple/areola complex.  No gross palpable lymph nodes.   Description of procedure: The patient was brought to the operating room and general anesthesia was induced. A time-out was completed verifying correct patient, procedure, site, positioning, and implant(s) and/or special equipment prior to beginning this procedure. The breast, chest wall, axilla, and upper arm and neck were prepped and draped in the usual sterile fashion.  A skin incision was made that encompassed the nipple-areola complex and passed in an oblique direction across the breast. Flaps were raised in the avascular plane between subcutaneous tissue and breast tissue from the clavicle superiorly, the sternum medially, the anterior rectus sheath inferiorly, and past the lateral border of the pectoralis major muscle laterally. Hemostasis was achieved in the flaps. Next, the breast tissue and underlying pectoralis fascia were excised from the pectoralis major muscle, progressing from medially to laterally. At the lateral border of the pectoralis major muscle, the breast tissue was swung laterally and a lateral pedicle identified where breast tissue gave way to fat of axilla. The lateral pedicle was incised and the specimen removed.   A hand-held gamma probe was used to identify the location of the hottest spot in the axilla. Dissection was carried down until subdermal facias was advanced. The probe was placed and  again, the point of maximal count was found. Dissection continue until nodule was identified. The probe was placed in contact with the node. The node was excised in its entirety.  An additional hot spot was detected and the node was excised in similar fashion. No additional hot spots were identified. No clinically abnormal nodes were palpated. The procedure was terminated. Hemostasis was achieved and the wound closed in layers with deep interrupted 3-0 Vicryl and skin was closed with subcuticular suture of Monocryl 3-0.   The wound was irrigated and hemostasis was achieved. Closed suction drain were brought into the operative field through a separate stab incision and sutured to the skin with a 3-0 nylon suture. The wound was closed with interrupted 3-0 Vicryl to the subcutaneous layer, followed by a subcuticular layer of Monocryl 4-0. The wound was dressed.  The patient tolerated the procedure well and was taken to the postanesthesia care unit in stable condition.   Sentinel Node Biopsy Synoptic Operative Report  Operation performed with curative intent:Yes  Tracer(s) used to identify sentinel nodes in the upfront surgery (non-neoadjuvant) setting (select all that apply):Radioactive Tracer  Tracer(s) used to identify sentinel nodes in the neoadjuvant setting (select all that apply):N/A  All nodes (colored or non-colored) present at the end of a dye-filled lymphatic channel were removed:N/A  All significantly radioactive nodes were removed:Yes  All palpable suspicious nodes were removed:N/A  Biopsy-proven positive nodes marked with clips prior to chemotherapy were identified and removed:N/A  Specimen: Left Breast                    Left axillary Sentinel lymph nodes #1, #  2.   Complications: None  Estimated Blood Loss: 10 mL

## 2022-06-21 NOTE — Interval H&P Note (Signed)
History and Physical Interval Note:  06/21/2022 8:49 AM  Gerald Hunter  has presented today for surgery, with the diagnosis of C50.022, Z17.0  Malignant neoplasm involving both nipple  areola of lt breast in male, estrogen receptor positive.  The various methods of treatment have been discussed with the patient and family. After consideration of risks, benefits and other options for treatment, the patient has consented to  Procedure(s): [place] (Left) as a surgical intervention.  The patient's history has been reviewed, patient examined, no change in status, stable for surgery.  I have reviewed the patient's chart and labs.  Questions were answered to the patient's satisfaction.     Herbert Pun

## 2022-06-21 NOTE — Anesthesia Procedure Notes (Signed)
Procedure Name: LMA Insertion Date/Time: 06/21/2022 9:19 AM  Performed by: Loletha Grayer, CRNAPre-anesthesia Checklist: Patient identified, Patient being monitored, Timeout performed, Emergency Drugs available and Suction available Patient Re-evaluated:Patient Re-evaluated prior to induction Oxygen Delivery Method: Circle system utilized Preoxygenation: Pre-oxygenation with 100% oxygen Induction Type: IV induction Ventilation: Mask ventilation without difficulty LMA: LMA inserted LMA Size: 4.0 Number of attempts: 1 Placement Confirmation: positive ETCO2 and breath sounds checked- equal and bilateral Tube secured with: Tape Dental Injury: Teeth and Oropharynx as per pre-operative assessment

## 2022-06-22 ENCOUNTER — Encounter: Payer: Self-pay | Admitting: General Surgery

## 2022-06-23 ENCOUNTER — Other Ambulatory Visit: Payer: Self-pay | Admitting: Pathology

## 2022-06-23 LAB — SURGICAL PATHOLOGY

## 2022-06-28 ENCOUNTER — Encounter: Payer: Self-pay | Admitting: *Deleted

## 2022-06-28 ENCOUNTER — Telehealth: Payer: Self-pay | Admitting: *Deleted

## 2022-06-28 NOTE — Telephone Encounter (Signed)
Oncotype Dx order # [id number]  submitted online on surgical pathology [id number] obtained 06/21/22

## 2022-06-28 NOTE — Progress Notes (Signed)
Oncotype order submitted, see phone note.

## 2022-07-06 NOTE — Progress Notes (Signed)
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  INTERVAL HISTORY: Patient returns to clinic today for further evaluation, discussion of his pathology results, and additional treatment planning.  He tolerated his simple mastectomy well without significant side effects.  He continues to be anxious, but otherwise feels well.  He has no neurologic complaints.  He denies any recent fevers or illnesses.  Has good appetite and denies weight loss.  He has no chest pain, shortness of breath, cough, or hemoptysis.  He denies any nausea, vomiting, constipation, or diarrhea.  He has no urinary complaints.  Patient offers no further specific complaints today.  REVIEW OF SYSTEMS:   Review of Systems  Constitutional: Negative.  Negative for fever, malaise/fatigue and weight loss.  Respiratory: Negative.  Negative for cough, hemoptysis and shortness of breath.   Cardiovascular: Negative.  Negative for chest pain and leg swelling.  Gastrointestinal: Negative.  Negative for abdominal pain.  Genitourinary: Negative.  Negative for dysuria.  Musculoskeletal: Negative.  Negative for back pain.  Skin: Negative.  Negative for rash.  Neurological: Negative.  Negative for dizziness, focal weakness, weakness and headaches.  Psychiatric/Behavioral:  The patient is nervous/anxious.     As per HPI. Otherwise, a complete review of systems is negative.  PAST MEDICAL HISTORY: Past Medical History:  Diagnosis Date   A-fib ([place])    Arthritis    Cataract cortical, senile    Chronic kidney disease    Colon polyps    Foraminal stenosis of lumbar region    Hypercholesteremia    Neuropathy    BOTH FEET   Sciatic pain, right    Tubular adenoma of  colon     PAST SURGICAL HISTORY: Past Surgical History:  Procedure Laterality Date   BREAST BIOPSY Left 06/02/2022   [place] Core Bx, Ribbon clip- path pending   COLONOSCOPY WITH PROPOFOL N/A 02/27/2016   Procedure: COLONOSCOPY WITH PROPOFOL;  Surgeon: Josefine Class, MD;  [facility] ENDOSCOPY;  Service: Endoscopy;  Laterality: N/A;   SIMPLE MASTECTOMY WITH AXILLARY SENTINEL NODE BIOPSY Left 06/21/2022   Procedure: SIMPLE MASTECTOMY WITH AXILLARY SENTINEL NODE BIOPSY;  Surgeon: Herbert Pun, MD;  Location: [facility] ORS;  Service: General;  Laterality: Left;   UPPER GASTROINTESTINAL ENDOSCOPY  2017    FAMILY HISTORY: Family History  Problem Relation Age of Onset   Pancreatic cancer Maternal Aunt    Esophageal cancer Son 57   Cancer Son        primary not determined, d. 84   Cancer Niece        unk type   Cancer Nephew        unk type    ADVANCED DIRECTIVES (Y/N):  N  HEALTH MAINTENANCE: Social History   Tobacco Use   Smoking status: Former    Types: Cigarettes    Quit date: 1960    Years since quitting: 63.7   Smokeless tobacco: Never  Vaping Use   Vaping Use: Never used  Substance Use Topics   Alcohol use: Not Currently    Alcohol/week: 7.0 standard drinks of alcohol    Types: 7 Standard drinks or equivalent per week    Comment: 7 drinks per week in week   Drug use: Never     Colonoscopy:  PAP:  Bone density:  Lipid panel:  No Known Allergies  Current Outpatient Medications  Medication Sig Dispense Refill   aspirin 325 MG tablet Take 325 mg by mouth every 6 (six) hours as needed.     atorvastatin (LIPITOR) 10 MG tablet Take 1 tablet by mouth daily.     Cholecalciferol (VITAMIN D-1000 MAX ST) 25 MCG (1000 UT) tablet Take by mouth.     cyanocobalamin (VITAMIN B12) 1000 MCG tablet Take by mouth.     ipratropium (ATROVENT) 0.03 % nasal spray Place into both nostrils.     neomycin-polymyxin b-dexamethasone (MAXITROL) 3.5-10000-0.1 OINT APPLY A SMALL  AMOUNT ON THE EYELID TWICE DAILY     pyridoxine (B-6) 100 MG tablet Take 100 mg by mouth daily.     No current facility-administered medications for this visit.    OBJECTIVE: Vitals:   07/08/22 1016  BP: 137/80  Pulse: 64  Resp: 16  Temp: (!) 96.6 F (35.9 C)  SpO2: 99%     Body mass index is 28.74 kg/m.    ECOG FS:0 - Asymptomatic  General: Well-developed, well-nourished, no acute distress. Eyes: Pink conjunctiva, anicteric sclera. HEENT: Normocephalic, moist mucous membranes. Lungs: No audible wheezing or coughing. Heart: Regular rate and rhythm. Abdomen: Soft, nontender, no obvious distention. Musculoskeletal: No edema, cyanosis, or clubbing. Neuro: Alert, answering all questions appropriately. Cranial nerves grossly intact. Skin: No rashes or petechiae noted. Psych: Normal affect.  LAB RESULTS:  Lab Results  Component Value Date   NA 138 06/16/2022   K 3.6 06/16/2022   CL 107 06/16/2022   CO2 22 06/16/2022   GLUCOSE 132 (H) 06/16/2022   BUN 17 06/16/2022   CREATININE 1.19 06/16/2022   CALCIUM 8.9 06/16/2022   PROT 7.2 12/13/2018   ALBUMIN 4.5 12/13/2018   AST 66 (H) 12/13/2018   ALT 39 12/13/2018   ALKPHOS 68 12/13/2018   BILITOT 1.3 (H) 12/13/2018   GFRNONAA 60 (L) 06/16/2022   GFRAA 59 (L) 12/13/2018    Lab Results  Component Value Date   WBC 11.8 (H) 06/16/2022   NEUTROABS 4.5 12/13/2018   HGB 15.9 06/16/2022   HCT 45.0 06/16/2022   MCV 90.5 06/16/2022   PLT 238 06/16/2022     STUDIES: NM Sentinel Node Inj-No Rpt (Breast)  Result Date: 06/21/2022 Sulfur Colloid was injected by the Nuclear Medicine Technologist for sentinel lymph node localization.    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   Patient underwent simple mastectomy on June 21, 2022.  Although he is a stage II, his Oncotype score was considered low risk therefore chemotherapy is not necessary.  Given his advanced age, patient is also hesitant to pursue systemic treatment.  He had consultation with radiation oncology today who recommends chest wall radiation for approximately 3 weeks.  Finally, given the ER/PR positivity of his tumor he will benefit from tamoxifen for minimum of 5 years.  Patient was given a prescription and instructed to start treatment at the conclusion of XRT.  Return to clinic in 3 months for further evaluation.   Genetics: Pending.  Patient is given a referral to genetics for further evaluation.  He does not have a significant family history of cancer, but has had 1 son who is in remission from esophageal cancer and another  son who passed away from stage IV pancreatic cancer.    I spent a total of 30 minutes reviewing chart data, face-to-face evaluation with the patient, counseling and coordination of care as detailed above.   Patient expressed understanding and was in agreement with this plan. He also understands that He can call clinic at any time with any questions, concerns, or complaints.    Cancer Staging  Invasive ductal carcinoma of left male breast [facility]) Staging form: Breast, AJCC 8th Edition - Clinical stage from 06/08/2022: Stage IIA (cT2, cN0, cM0, G3, ER+, PR+, HER2-) - Signed by Lloyd Huger, MD on 06/08/2022 Stage prefix: Initial diagnosis Histologic grading system: 3 grade system   Lloyd Huger, MD   07/09/2022 3:09 PM

## 2022-07-07 ENCOUNTER — Encounter: Payer: Self-pay | Admitting: Oncology

## 2022-07-08 ENCOUNTER — Inpatient Hospital Stay: Payer: Medicare Other | Attending: Oncology | Admitting: Oncology

## 2022-07-08 ENCOUNTER — Encounter: Payer: Self-pay | Admitting: Oncology

## 2022-07-08 ENCOUNTER — Inpatient Hospital Stay: Payer: Medicare Other

## 2022-07-08 ENCOUNTER — Inpatient Hospital Stay (HOSPITAL_BASED_OUTPATIENT_CLINIC_OR_DEPARTMENT_OTHER): Payer: Medicare Other | Admitting: Licensed Clinical Social Worker

## 2022-07-08 ENCOUNTER — Encounter: Payer: Self-pay | Admitting: Licensed Clinical Social Worker

## 2022-07-08 ENCOUNTER — Ambulatory Visit
Admission: RE | Admit: 2022-07-08 | Discharge: 2022-07-08 | Disposition: A | Discharge status: Home / Self Care | Payer: Medicare Other | Source: Ambulatory Visit | Attending: Radiation Oncology | Admitting: Radiation Oncology

## 2022-07-08 ENCOUNTER — Encounter: Payer: Self-pay | Admitting: *Deleted

## 2022-07-08 VITALS — BP 137/80 | HR 64 | Temp 96.6°F | Resp 16 | Ht 67.0 in | Wt 183.5 lb

## 2022-07-08 DIAGNOSIS — C50922 Malignant neoplasm of unspecified site of left male breast: Secondary | ICD-10-CM

## 2022-07-08 DIAGNOSIS — N189 Chronic kidney disease, unspecified: Secondary | ICD-10-CM | POA: Insufficient documentation

## 2022-07-08 DIAGNOSIS — Z79899 Other long term (current) drug therapy: Secondary | ICD-10-CM | POA: Diagnosis not present

## 2022-07-08 DIAGNOSIS — Z86010 Personal history of colonic polyps: Secondary | ICD-10-CM | POA: Diagnosis not present

## 2022-07-08 DIAGNOSIS — Z800 Family history of malignant neoplasm of digestive organs: Secondary | ICD-10-CM

## 2022-07-08 DIAGNOSIS — Z170 Estrogen receptor positive status [ER+]: Secondary | ICD-10-CM | POA: Insufficient documentation

## 2022-07-08 DIAGNOSIS — Z87891 Personal history of nicotine dependence: Secondary | ICD-10-CM | POA: Insufficient documentation

## 2022-07-08 DIAGNOSIS — E7800 Pure hypercholesterolemia, unspecified: Secondary | ICD-10-CM | POA: Insufficient documentation

## 2022-07-08 DIAGNOSIS — I4891 Unspecified atrial fibrillation: Secondary | ICD-10-CM | POA: Insufficient documentation

## 2022-07-08 DIAGNOSIS — Z8719 Personal history of other diseases of the digestive system: Secondary | ICD-10-CM | POA: Insufficient documentation

## 2022-07-08 DIAGNOSIS — C50022 Malignant neoplasm of nipple and areola, left male breast: Secondary | ICD-10-CM | POA: Diagnosis present

## 2022-07-08 DIAGNOSIS — Z7952 Long term (current) use of systemic steroids: Secondary | ICD-10-CM | POA: Insufficient documentation

## 2022-07-08 NOTE — Consult Note (Signed)
NEW PATIENT EVALUATION  Name: Gerald Hunter  MRN: [id number]  Date:   07/08/2022     DOB: [birth year]   This 88 y.o. male patient presents to the clinic for initial evaluation of at least stage IIa.  ER/PR positive invasive mammary carcinoma of the left breast status post left mastectomy  REFERRING PHYSICIAN: Kirk Ruths, MD  CHIEF COMPLAINT: No chief complaint on file.   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   PREVIOUS INVESTIGATIONS:  Mammograms ultrasound reviewed Clinical notes reviewed Pathology report reviewed  HPI: Patient is a 88 year old male presented to his GP with nipple irritation over the past 3 months.  Mammogram was performed showing a 2.5 cm mass in the left retroareolar breast with extension to the nipple suspicious for malignancy.  No mammographic evidence of or ultrasound evidence of left axillary adenopathy.  Initial biopsy was positive for invasive mammary carcinoma.  He went on to have a left mastectomy showing a 2.6 cm invasive mammary carcinoma overall grade 3.  Skin was present and involved directly.  Carcinoma directly invaded into the dermis or epidermis without skin ulceration.  Margins were clear at 11 mm.  3 lymph nodes 1 sentinel was examined negative for metastatic disease.  Tumor was ER/PR positive HER2/neu not overexpressed.  Oncotype Dx showed low risk of recurrence n alleviating the need for work systemic chemotherapy.  Patient is been 6 healing slowly still has some moderate erythema around his incision site and some swelling in his left axilla.  He is otherwise without complaint.  He is now referred to radiation oncology for consideration of treatment.  PLANNED TREATMENT REGIMEN: Left chest wall radiation therapy  PAST MEDICAL HISTORY:  has a past medical history of A-fib ([place]), Arthritis, Cataract cortical, senile, Chronic kidney disease, Colon polyps, Foraminal stenosis of lumbar region,  Hypercholesteremia, Neuropathy, Sciatic pain, right, and Tubular adenoma of colon.    PAST SURGICAL HISTORY:  Past Surgical History:  Procedure Laterality Date   BREAST BIOPSY Left 06/02/2022   [place] Core Bx, Ribbon clip- path pending   COLONOSCOPY WITH PROPOFOL N/A 02/27/2016   Procedure: COLONOSCOPY WITH PROPOFOL;  Surgeon: Josefine Class, MD;  Location: [facility] ENDOSCOPY;  Service: Endoscopy;  Laterality: N/A;   SIMPLE MASTECTOMY WITH AXILLARY SENTINEL NODE BIOPSY Left 06/21/2022   Procedure: SIMPLE MASTECTOMY WITH AXILLARY SENTINEL NODE BIOPSY;  Surgeon: Herbert Pun, MD;  Location: [facility] ORS;  Service: General;  Laterality: Left;   UPPER GASTROINTESTINAL ENDOSCOPY  2017    FAMILY HISTORY: family history includes Cancer in his nephew, niece, and son; Esophageal cancer (age of onset: 17) in his son; Pancreatic cancer in his maternal aunt.  SOCIAL HISTORY:  reports that he quit smoking about 63 years ago. His smoking use included cigarettes. He has never used smokeless tobacco. He reports that he does not currently use alcohol after a past usage of about 7.0 standard drinks of alcohol per week. He reports that he does not use drugs.  ALLERGIES: Patient has no known allergies.  MEDICATIONS:  Current Outpatient Medications  Medication Sig Dispense Refill   aspirin 325 MG tablet Take 325 mg by mouth every 6 (six) hours as needed.     atorvastatin (LIPITOR) 10 MG tablet Take 1 tablet by mouth daily.     Cholecalciferol (VITAMIN D-1000 MAX ST) 25 MCG (1000 UT) tablet Take by mouth.     cyanocobalamin (VITAMIN B12) 1000 MCG tablet Take by mouth.     ipratropium (ATROVENT)  0.03 % nasal spray Place into both nostrils.     neomycin-polymyxin b-dexamethasone (MAXITROL) 3.5-10000-0.1 OINT APPLY A SMALL AMOUNT ON THE EYELID TWICE DAILY     pyridoxine (B-6) 100 MG tablet Take 100 mg by mouth daily.     No current facility-administered medications for this encounter.    ECOG PERFORMANCE  STATUS:  0 - Asymptomatic  REVIEW OF SYSTEMS: Patient denies any weight loss, fatigue, weakness, fever, chills or night sweats. Patient denies any loss of vision, blurred vision. Patient denies any ringing  of the ears or hearing loss. No irregular heartbeat. Patient denies heart murmur or history of fainting. Patient denies any chest pain or pain radiating to her upper extremities. Patient denies any shortness of breath, difficulty breathing at night, cough or hemoptysis. Patient denies any swelling in the lower legs. Patient denies any nausea vomiting, vomiting of blood, or coffee ground material in the vomitus. Patient denies any stomach pain. Patient states has had normal bowel movements no significant constipation or diarrhea. Patient denies any dysuria, hematuria or significant nocturia. Patient denies any problems walking, swelling in the joints or loss of balance. Patient denies any skin changes, loss of hair or loss of weight. Patient denies any excessive worrying or anxiety or significant depression. Patient denies any problems with insomnia. Patient denies excessive thirst, polyuria, polydipsia. Patient denies any swollen glands, patient denies easy bruising or easy bleeding. Patient denies any recent infections, allergies or URI. Patient "s visual fields have not changed significantly in recent time.   PHYSICAL EXAM: There were no vitals taken for this visit. Patient status post left mastectomy with incision healing well some erythema around the scar is noted.  No skin nodules are noted.  Right chest is free of mass or nodularity no axillary or supraclavicular adenopathy is identified.  Well-developed well-nourished patient in NAD. HEENT reveals PERLA, EOMI, discs not visualized.  Oral cavity is clear. No oral mucosal lesions are identified. Neck is clear without evidence of cervical or supraclavicular adenopathy. Lungs are clear to A&P. Cardiac examination is essentially unremarkable with regular  rate and rhythm without murmur rub or thrill. Abdomen is benign with no organomegaly or masses noted. Motor sensory and DTR levels are equal and symmetric in the upper and lower extremities. Cranial nerves II through XII are grossly intact. Proprioception is intact. No peripheral adenopathy or edema is identified. No motor or sensory levels are noted. Crude visual fields are within normal range.  LABORATORY DATA: Pathology reviewed    RADIOLOGY RESULTS: Mammograms and ultrasound reviewed compatible with above-stated findings   IMPRESSION: Mammogram and ultrasound reviewed compatible with above-stated findings  PLAN: This time I have recommended chest wall radiation therapy would treat with a hypofractionated course of treatment over 3 weeks boosting his scar another 1000 cGy using electron beam.  Risks and benefits of treatment occluding skin reaction fatigue possible inclusion of superficial lung all were reviewed with the patient in detail.  Based on his negative sentinel nodes do not feel inclined to treat with axillary or peripheral lymphatic radiation.  His poor prognostic factors are his size of his tumor grade 3 and direct skin involvement.  Patient comprehends my recommendations well if personally 7 ordered CT simulation for about a week and a half down the road.  I would like to take this opportunity to thank you for allowing me to participate in the care of your patient.Noreene Filbert, MD

## 2022-07-08 NOTE — Progress Notes (Signed)
[name] had asked if there are any male breast cancer support groups.   After some investigation, unfortunately there are not any in this area.   There is an online support group through [email or web address]   Mr. Pritt said he will look into that.

## 2022-07-08 NOTE — Progress Notes (Signed)
REFERRING PROVIDER: Lloyd Huger, MD [address] [place],  [place] [address]  PRIMARY PROVIDER:  Kirk Ruths, MD  PRIMARY REASON FOR VISIT:  1. 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 Family history of pancreatic cancer   3. Family history of esophageal cancer      HISTORY OF PRESENT ILLNESS:   [name], a 88 y.o. male, was seen for a [place] cancer genetics consultation at the request of Dr. Grayland Ormond due to a personal and family history of cancer.  Mr. Runco presents to clinic today to discuss the possibility of a hereditary predisposition to cancer, genetic testing, and to further clarify his future cancer risks, as well as potential cancer risks for family members.    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 Huish was diagnosed with invasive ductal carcinoma of the left breast, ER/PR+, HER2-. Mr. Sprinkle had surgery (left mastectomy) on 06/21/2022. He meets with Dr. Grayland Ormond today.  Mr. Herendeen reports a few colon polyps and normal prostate cancer screening.  Past Medical History:  Diagnosis Date   A-fib ([place])    Arthritis    Cataract cortical, senile    Chronic kidney disease    Colon polyps    Foraminal stenosis of lumbar region    Hypercholesteremia    Neuropathy    BOTH FEET   Sciatic pain, right    Tubular adenoma of colon     Past Surgical History:  Procedure Laterality Date   BREAST BIOPSY Left 06/02/2022   [place] Core Bx, Ribbon clip- path pending   COLONOSCOPY WITH PROPOFOL N/A 02/27/2016    Procedure: COLONOSCOPY WITH PROPOFOL;  Surgeon: Josefine Class, MD;  Location: [facility] ENDOSCOPY;  Service: Endoscopy;  Laterality: N/A;   SIMPLE MASTECTOMY WITH AXILLARY SENTINEL NODE BIOPSY Left 06/21/2022   Procedure: SIMPLE MASTECTOMY WITH AXILLARY SENTINEL NODE BIOPSY;  Surgeon: Herbert Pun, MD;  Location: [facility] ORS;  Service: General;  Laterality: Left;   UPPER GASTROINTESTINAL ENDOSCOPY  2017    FAMILY HISTORY:  We obtained a detailed, 4-generation family history.  Significant diagnoses are listed below: Family History  Problem Relation Age of Onset   Pancreatic cancer Maternal Aunt    Esophageal cancer Son 32   Cancer Son        primary not determined, d. 31   Cancer Niece        unk type   Cancer Nephew        unk type   Mr. Bilton has 4 sons, 2 daughters. One son had cancer, primary not determined, and passed at 82. Another son had esophageal cancer at 50 and is living at 72. Mr. Simington had 2 brothers and 2 sisters, none had cancer. A niece and a nephew (siblings) had cancer, unknown types.   Mr. Devan mother died at 53. Patient had a maternal aunt who had pancreatic cancer. No other known cancers on this side of the family.  Mr. Lizer father died at 59. Patient has limited information about this side of  the family, no known cancerse.  Mr. Helvey is unaware of previous family history of genetic testing for hereditary cancer risks. There is no reported Ashkenazi Jewish ancestry. There is no known consanguinity.   GENETIC COUNSELING ASSESSMENT: Mr. Veltre is a 88 y.o. male with a personal and family history of cancer which is somewhat suggestive of a hereditary cancer syndrome and predisposition to cancer. We, therefore, discussed and recommended the following at today's visit.   DISCUSSION: We discussed that approximately 10% of breast cancer is hereditary. Most cases of hereditary male breast cancer are associated with BRCA2 gene, although there are other genes  associated with hereditary cancer as well. Cancers and risks are gene specific. We discussed that testing is beneficial for several reasons including knowing about cancer risks, identifying potential screening and risk-reduction options that may be appropriate, and to understand if other family members could be at risk for cancer and allow them to undergo genetic testing.   We reviewed the characteristics, features and inheritance patterns of hereditary cancer syndromes. We also discussed genetic testing, including the appropriate family members to test, the process of testing, insurance coverage and turn-around-time for results. We discussed the implications of a negative, positive and/or variant of uncertain significant result. We recommended Mr. Ursua pursue genetic testing for the [facility] Common Hereditary Cancers+RNA gene panel.   The Common Hereditary Cancers Panel + RNA offered by [facility] includes sequencing and/or deletion duplication testing of the following 47 genes: APC, ATM, AXIN2, BARD1, BMPR1A, BRCA1, BRCA2, BRIP1, CDH1, CDKN2A (p14ARF), CDKN2A (p16INK4a), CKD4, CHEK2, CTNNA1, DICER1, EPCAM (Deletion/duplication testing only), GREM1 (promoter region deletion/duplication testing only), KIT, MEN1, MLH1, MSH2, MSH3, MSH6, MUTYH, NBN, NF1, NHTL1, PALB2, PDGFRA, PMS2, POLD1, POLE, PTEN, RAD50, RAD51C, RAD51D, SDHB, SDHC, SDHD, SMAD4, SMARCA4. STK11, TP53, TSC1, TSC2, and VHL.  The following genes were evaluated for sequence changes only: SDHA and HOXB13 c.251G>A variant only.  Based on Mr. Commons personal and family history of cancer, he meets medical criteria for genetic testing. Despite that he meets criteria, he may still have an out of pocket cost. We discussed that if his out of pocket cost for testing is over $100, the laboratory will call and confirm whether he wants to proceed with testing.  If the out of pocket cost of testing is less than $100 he will be billed by the genetic testing  laboratory.   PLAN: After considering the risks, benefits, and limitations, Mr. Kampf provided informed consent to pursue genetic testing and the blood sample was sent to [facility] for analysis of the Common Hereditary Cancers+RNA panel. Results should be available within approximately 2-3 weeks' time, at which point they will be disclosed by telephone to Mr. Lancon, as will any additional recommendations warranted by these results. Mr. Zepeda will receive a summary of his genetic counseling visit and a copy of his results once available. This information will also be available in Epic.   Mr. Berkovich questions were answered to his satisfaction today. Our contact information was provided should additional questions or concerns arise. Thank you for the referral and allowing [place] to share in the care of your patient.   Faith Rogue, MS, [facility] Genetic Counselor [email or web address] Phone: [phone]  The patient was seen for a total of 30 minutes in face-to-face genetic counseling.  Mr. Guardia wife Mel Almond was also present. Dr. Grayland Ormond was available for discussion regarding this case.   _______________________________________________________________________ For Office Staff:  Number of people involved in session: 2 Was an Intern/ student involved with  case: no  

## 2022-07-14 ENCOUNTER — Encounter: Payer: Self-pay | Admitting: *Deleted

## 2022-07-14 ENCOUNTER — Inpatient Hospital Stay: Payer: Medicare Other | Admitting: Occupational Therapy

## 2022-07-14 NOTE — Progress Notes (Signed)
Called patient to let him know about [facility] meeting tomorrow since he had been interested in possible support groups.   He said he is not interested at this time.

## 2022-07-20 ENCOUNTER — Ambulatory Visit
Admission: RE | Admit: 2022-07-20 | Discharge: 2022-07-20 | Disposition: A | Discharge status: Home / Self Care | Payer: Medicare Other | Source: Ambulatory Visit | Attending: Radiation Oncology | Admitting: Radiation Oncology

## 2022-07-20 DIAGNOSIS — Z510 Encounter for antineoplastic radiation therapy: Secondary | ICD-10-CM | POA: Diagnosis present

## 2022-07-20 DIAGNOSIS — C50922 Malignant neoplasm of unspecified site of left male breast: Secondary | ICD-10-CM | POA: Insufficient documentation

## 2022-07-20 DIAGNOSIS — Z170 Estrogen receptor positive status [ER+]: Secondary | ICD-10-CM | POA: Diagnosis not present

## 2022-07-21 DIAGNOSIS — Z510 Encounter for antineoplastic radiation therapy: Secondary | ICD-10-CM | POA: Diagnosis not present

## 2022-07-23 ENCOUNTER — Other Ambulatory Visit: Payer: Self-pay | Admitting: *Deleted

## 2022-07-23 DIAGNOSIS — C50922 Malignant neoplasm of unspecified site of left male breast: Secondary | ICD-10-CM

## 2022-07-27 ENCOUNTER — Encounter: Payer: Self-pay | Admitting: Licensed Clinical Social Worker

## 2022-07-27 ENCOUNTER — Telehealth: Payer: Self-pay | Admitting: Licensed Clinical Social Worker

## 2022-07-27 ENCOUNTER — Ambulatory Visit: Admission: RE | Admit: 2022-07-27 | Payer: Medicare Other | Source: Ambulatory Visit

## 2022-07-27 ENCOUNTER — Ambulatory Visit: Payer: Self-pay | Admitting: Licensed Clinical Social Worker

## 2022-07-27 DIAGNOSIS — Z1379 Encounter for other screening for genetic and chromosomal anomalies: Secondary | ICD-10-CM | POA: Insufficient documentation

## 2022-07-27 DIAGNOSIS — Z510 Encounter for antineoplastic radiation therapy: Secondary | ICD-10-CM | POA: Diagnosis not present

## 2022-07-27 NOTE — Telephone Encounter (Signed)
I contacted Mr. Kothari to discuss his genetic testing results. No pathogenic variants were identified in the 47 genes analyzed. Detailed clinic note to follow.   The test report has been scanned into EPIC and is located under the Molecular Pathology section of the Results Review tab.  A portion of the result report is included below for reference.      Faith Rogue, MS, [facility] Genetic Counselor [email or web address] Phone: [phone]

## 2022-07-27 NOTE — Progress Notes (Signed)
HPI:   [name] was previously seen in the [place] [facility] clinic due to a personal and family history of cancer and concerns regarding a hereditary predisposition to cancer. Please refer to our prior cancer genetics clinic note for more information regarding our discussion, assessment and recommendations, at the time. [name] recent genetic test results were disclosed to him, as were recommendations warranted by these results. These results and recommendations are discussed in more detail below.  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 No pathogenic variants identified on the [facility] Common Hereditary Cancers+RNA panel. The report date is 07/24/2022.  The Common Hereditary Cancers Panel + RNA offered by [facility] includes sequencing and/or deletion duplication testing of the following 47 genes: APC, ATM, AXIN2, BARD1, BMPR1A, BRCA1, BRCA2, BRIP1, CDH1, CDKN2A (p14ARF), CDKN2A (p16INK4a), CKD4, CHEK2, CTNNA1, DICER1, EPCAM (Deletion/duplication testing only), GREM1 (promoter region deletion/duplication testing only), KIT, MEN1, MLH1, MSH2, MSH3, MSH6, MUTYH, NBN, NF1, NHTL1, PALB2, PDGFRA, PMS2, POLD1, POLE, PTEN, RAD50, RAD51C, RAD51D, SDHB, SDHC, SDHD, SMAD4, SMARCA4. STK11, TP53, TSC1, TSC2, and VHL.  The following genes were evaluated for sequence changes only: SDHA and HOXB13 c.251G>A variant only.     FAMILY HISTORY:  We obtained a detailed, 4-generation family history.  Significant diagnoses are listed below: Family History  Problem  Relation Age of Onset   Pancreatic cancer Maternal Aunt    Esophageal cancer Son 59   Cancer Son        primary not determined, d. 80   Cancer Niece        unk type   Cancer Nephew        unk type   [name] has 4 sons, 2 daughters. One son had cancer, primary not determined, and passed at 43. Another son had esophageal cancer at 32 and is living at 24. [name] had 2 brothers and 2 sisters, none had cancer. A niece and a nephew (siblings) had cancer, unknown types.    [name] mother died at 31. Patient had a maternal aunt who had pancreatic cancer. No other known cancers on this side of the family.   [name] father died at 79. Patient has limited information about this side of the family, no known cancerse.   [name] is unaware of previous family history of genetic testing for hereditary cancer risks. There is no reported Ashkenazi Jewish ancestry. There is no known consanguinity.      GENETIC TEST RESULTS:  The [facility] Common Hereditary Cancers+RNA Panel found no pathogenic mutations.   The Common Hereditary Cancers Panel + RNA offered by [facility] includes sequencing and/or deletion duplication testing of the following 47 genes: APC, ATM, AXIN2, BARD1, BMPR1A, BRCA1, BRCA2, BRIP1, CDH1, CDKN2A (p14ARF), CDKN2A (p16INK4a), CKD4, CHEK2, CTNNA1, DICER1, EPCAM (Deletion/duplication testing only), GREM1 (promoter region deletion/duplication testing only), KIT, MEN1, MLH1, MSH2, MSH3, MSH6, MUTYH, NBN, NF1, NHTL1, PALB2, PDGFRA, PMS2, POLD1, POLE, PTEN, RAD50, RAD51C, RAD51D, SDHB, SDHC, SDHD, SMAD4, SMARCA4. STK11, TP53, TSC1, TSC2, and VHL.  The following genes were evaluated for sequence changes only:  SDHA and HOXB13 c.251G>A variant only.   The test report has been scanned into EPIC and is located under the Molecular Pathology section of the Results Review tab.  A portion of the result report is included below for reference. Genetic testing reported out on 07/26/2022.       Even though a pathogenic variant was not identified, possible explanations for the cancer in the family may include: There may be no hereditary risk for cancer in the family. The cancers in [name] and/or his family may be sporadic/familial or due to other genetic and environmental factors. There may be a gene mutation in one of these genes that current testing methods cannot detect but that chance is small. There could be another gene that has not yet been discovered, or that we have not yet tested, that is responsible for the cancer diagnoses in the family.  It is also possible there is a hereditary cause for the cancer in the family that [name] did not inherit.   Therefore, it is important to remain in touch with cancer genetics in the future so that we can continue to offer [name] the most up to date genetic testing.   ADDITIONAL GENETIC TESTING:  We discussed with [name] that his genetic testing was fairly extensive.  If there are additional relevant genes identified to increase cancer risk that can be analyzed in the future, we would be happy to discuss and coordinate this testing at that time.    CANCER SCREENING RECOMMENDATIONS:  [name] test result is considered negative (normal).  This means that we have not identified a hereditary cause for his personal and family history of cancer at this time.   An individual's cancer risk and medical management are not determined by genetic test results alone. Overall cancer risk assessment incorporates additional factors, including personal medical history, family history, and any available genetic information that may result in a personalized plan for cancer prevention and surveillance. Therefore, it is recommended he continue to follow the cancer management and screening guidelines provided by his oncology and primary healthcare provider.  RECOMMENDATIONS FOR FAMILY MEMBERS:   Since he did not inherit a identifiable  mutation in a cancer predisposition gene included on this panel, his children could not have inherited a known mutation from him in one of these genes. Individuals in this family might be at some increased risk of developing cancer, over the general population risk, due to the family history of cancer.  Individuals in the family should notify their providers of the family history of cancer. We recommend women in this family have a yearly mammogram beginning at age 54, or 69 years younger than the earliest onset of cancer, an annual clinical breast exam, and perform monthly breast self-exams.  Family members should have colonoscopies by at age 2, or earlier, as recommended by their providers.   FOLLOW-UP:  Lastly, we discussed with [name] that cancer genetics is a rapidly advancing field and it is possible that new genetic tests will be appropriate for him and/or his family members in the future. We encouraged him to remain in contact with cancer genetics on an annual basis so we can update his personal and family histories and let him know of advances in cancer genetics that may benefit this family.   Our contact number was provided. [name] questions were answered to his satisfaction, and he knows he is welcome to call us at anytime with additional questions or concerns.  Faith Rogue, MS, [facility] Genetic Counselor [email or web address] Phone: [phone]

## 2022-07-28 ENCOUNTER — Other Ambulatory Visit: Payer: Self-pay

## 2022-07-28 ENCOUNTER — Ambulatory Visit
Admission: RE | Admit: 2022-07-28 | Discharge: 2022-07-28 | Disposition: A | Discharge status: Home / Self Care | Payer: Medicare Other | Source: Ambulatory Visit | Attending: Radiation Oncology | Admitting: Radiation Oncology

## 2022-07-28 DIAGNOSIS — Z510 Encounter for antineoplastic radiation therapy: Secondary | ICD-10-CM | POA: Diagnosis not present

## 2022-07-28 LAB — RAD ONC ARIA SESSION SUMMARY
Course Elapsed Days: 0
Plan Fractions Treated to Date: 1
Plan Prescribed Dose Per Fraction: 2.66 Gy
Plan Total Fractions Prescribed: 16
Plan Total Prescribed Dose: 42.56 Gy
Reference Point Dosage Given to Date: 2.66 Gy
Reference Point Session Dosage Given: 2.66 Gy
Session Number: 1

## 2022-07-29 ENCOUNTER — Ambulatory Visit
Admission: RE | Admit: 2022-07-29 | Discharge: 2022-07-29 | Disposition: A | Discharge status: Home / Self Care | Payer: Medicare Other | Source: Ambulatory Visit | Attending: Radiation Oncology | Admitting: Radiation Oncology

## 2022-07-29 ENCOUNTER — Other Ambulatory Visit: Payer: Self-pay

## 2022-07-29 DIAGNOSIS — Z510 Encounter for antineoplastic radiation therapy: Secondary | ICD-10-CM | POA: Diagnosis not present

## 2022-07-29 LAB — RAD ONC ARIA SESSION SUMMARY
Course Elapsed Days: 1
Plan Fractions Treated to Date: 2
Plan Prescribed Dose Per Fraction: 2.66 Gy
Plan Total Fractions Prescribed: 16
Plan Total Prescribed Dose: 42.56 Gy
Reference Point Dosage Given to Date: 5.32 Gy
Reference Point Session Dosage Given: 2.66 Gy
Session Number: 2

## 2022-07-30 ENCOUNTER — Other Ambulatory Visit: Payer: Self-pay

## 2022-07-30 ENCOUNTER — Ambulatory Visit
Admission: RE | Admit: 2022-07-30 | Discharge: 2022-07-30 | Disposition: A | Discharge status: Home / Self Care | Payer: Medicare Other | Source: Ambulatory Visit | Attending: Radiation Oncology | Admitting: Radiation Oncology

## 2022-07-30 DIAGNOSIS — Z510 Encounter for antineoplastic radiation therapy: Secondary | ICD-10-CM | POA: Diagnosis not present

## 2022-07-30 LAB — RAD ONC ARIA SESSION SUMMARY
Course Elapsed Days: 2
Plan Fractions Treated to Date: 3
Plan Prescribed Dose Per Fraction: 2.66 Gy
Plan Total Fractions Prescribed: 16
Plan Total Prescribed Dose: 42.56 Gy
Reference Point Dosage Given to Date: 7.98 Gy
Reference Point Session Dosage Given: 2.66 Gy
Session Number: 3

## 2022-08-02 ENCOUNTER — Ambulatory Visit
Admission: RE | Admit: 2022-08-02 | Discharge: 2022-08-02 | Disposition: A | Discharge status: Home / Self Care | Payer: Medicare Other | Source: Ambulatory Visit | Attending: Radiation Oncology | Admitting: Radiation Oncology

## 2022-08-02 ENCOUNTER — Other Ambulatory Visit: Payer: Self-pay

## 2022-08-02 DIAGNOSIS — Z510 Encounter for antineoplastic radiation therapy: Secondary | ICD-10-CM | POA: Diagnosis present

## 2022-08-02 DIAGNOSIS — C50922 Malignant neoplasm of unspecified site of left male breast: Secondary | ICD-10-CM | POA: Insufficient documentation

## 2022-08-02 DIAGNOSIS — Z170 Estrogen receptor positive status [ER+]: Secondary | ICD-10-CM | POA: Insufficient documentation

## 2022-08-02 LAB — RAD ONC ARIA SESSION SUMMARY
Course Elapsed Days: 5
Plan Fractions Treated to Date: 4
Plan Prescribed Dose Per Fraction: 2.66 Gy
Plan Total Fractions Prescribed: 16
Plan Total Prescribed Dose: 42.56 Gy
Reference Point Dosage Given to Date: 10.64 Gy
Reference Point Session Dosage Given: 2.66 Gy
Session Number: 4

## 2022-08-03 ENCOUNTER — Ambulatory Visit
Admission: RE | Admit: 2022-08-03 | Discharge: 2022-08-03 | Disposition: A | Discharge status: Home / Self Care | Payer: Medicare Other | Source: Ambulatory Visit | Attending: Radiation Oncology | Admitting: Radiation Oncology

## 2022-08-03 ENCOUNTER — Other Ambulatory Visit: Payer: Self-pay

## 2022-08-03 DIAGNOSIS — Z510 Encounter for antineoplastic radiation therapy: Secondary | ICD-10-CM | POA: Diagnosis not present

## 2022-08-03 LAB — RAD ONC ARIA SESSION SUMMARY
Course Elapsed Days: 6
Plan Fractions Treated to Date: 5
Plan Prescribed Dose Per Fraction: 2.66 Gy
Plan Total Fractions Prescribed: 16
Plan Total Prescribed Dose: 42.56 Gy
Reference Point Dosage Given to Date: 13.3 Gy
Reference Point Session Dosage Given: 2.66 Gy
Session Number: 5

## 2022-08-04 ENCOUNTER — Other Ambulatory Visit: Payer: Self-pay

## 2022-08-04 ENCOUNTER — Ambulatory Visit
Admission: RE | Admit: 2022-08-04 | Discharge: 2022-08-04 | Disposition: A | Discharge status: Home / Self Care | Payer: Medicare Other | Source: Ambulatory Visit | Attending: Radiation Oncology | Admitting: Radiation Oncology

## 2022-08-04 ENCOUNTER — Inpatient Hospital Stay: Payer: Medicare Other

## 2022-08-04 DIAGNOSIS — Z170 Estrogen receptor positive status [ER+]: Secondary | ICD-10-CM | POA: Insufficient documentation

## 2022-08-04 DIAGNOSIS — Z510 Encounter for antineoplastic radiation therapy: Secondary | ICD-10-CM | POA: Diagnosis not present

## 2022-08-04 DIAGNOSIS — C50922 Malignant neoplasm of unspecified site of left male breast: Secondary | ICD-10-CM | POA: Insufficient documentation

## 2022-08-04 LAB — RAD ONC ARIA SESSION SUMMARY
Course Elapsed Days: 7
Plan Fractions Treated to Date: 6
Plan Prescribed Dose Per Fraction: 2.66 Gy
Plan Total Fractions Prescribed: 16
Plan Total Prescribed Dose: 42.56 Gy
Reference Point Dosage Given to Date: 15.96 Gy
Reference Point Session Dosage Given: 2.66 Gy
Session Number: 6

## 2022-08-04 LAB — CBC
HCT: 47.1 % (ref 39.0–52.0)
Hemoglobin: 16.2 g/dL (ref 13.0–17.0)
MCH: 31.1 pg (ref 26.0–34.0)
MCHC: 34.4 g/dL (ref 30.0–36.0)
MCV: 90.4 fL (ref 80.0–100.0)
Platelets: 250 10*3/uL (ref 150–400)
RBC: 5.21 MIL/uL (ref 4.22–5.81)
RDW: 14 % (ref 11.5–15.5)
WBC: 7.3 10*3/uL (ref 4.0–10.5)
nRBC: 0 % (ref 0.0–0.2)

## 2022-08-05 ENCOUNTER — Ambulatory Visit
Admission: RE | Admit: 2022-08-05 | Discharge: 2022-08-05 | Disposition: A | Discharge status: Home / Self Care | Payer: Medicare Other | Source: Ambulatory Visit | Attending: Radiation Oncology | Admitting: Radiation Oncology

## 2022-08-05 ENCOUNTER — Other Ambulatory Visit: Payer: Self-pay

## 2022-08-05 DIAGNOSIS — Z510 Encounter for antineoplastic radiation therapy: Secondary | ICD-10-CM | POA: Diagnosis not present

## 2022-08-05 LAB — RAD ONC ARIA SESSION SUMMARY
Course Elapsed Days: 8
Plan Fractions Treated to Date: 7
Plan Prescribed Dose Per Fraction: 2.66 Gy
Plan Total Fractions Prescribed: 16
Plan Total Prescribed Dose: 42.56 Gy
Reference Point Dosage Given to Date: 18.62 Gy
Reference Point Session Dosage Given: 2.66 Gy
Session Number: 7

## 2022-08-06 ENCOUNTER — Ambulatory Visit
Admission: RE | Admit: 2022-08-06 | Discharge: 2022-08-06 | Disposition: A | Discharge status: Home / Self Care | Payer: Medicare Other | Source: Ambulatory Visit | Attending: Radiation Oncology | Admitting: Radiation Oncology

## 2022-08-06 ENCOUNTER — Other Ambulatory Visit: Payer: Self-pay

## 2022-08-06 DIAGNOSIS — Z510 Encounter for antineoplastic radiation therapy: Secondary | ICD-10-CM | POA: Diagnosis not present

## 2022-08-06 LAB — RAD ONC ARIA SESSION SUMMARY
Course Elapsed Days: 9
Plan Fractions Treated to Date: 8
Plan Prescribed Dose Per Fraction: 2.66 Gy
Plan Total Fractions Prescribed: 16
Plan Total Prescribed Dose: 42.56 Gy
Reference Point Dosage Given to Date: 21.28 Gy
Reference Point Session Dosage Given: 2.66 Gy
Session Number: 8

## 2022-08-09 ENCOUNTER — Ambulatory Visit
Admission: RE | Admit: 2022-08-09 | Discharge: 2022-08-09 | Disposition: A | Discharge status: Home / Self Care | Payer: Medicare Other | Source: Ambulatory Visit | Attending: Radiation Oncology | Admitting: Radiation Oncology

## 2022-08-09 ENCOUNTER — Other Ambulatory Visit: Payer: Self-pay

## 2022-08-09 DIAGNOSIS — Z510 Encounter for antineoplastic radiation therapy: Secondary | ICD-10-CM | POA: Diagnosis not present

## 2022-08-09 LAB — RAD ONC ARIA SESSION SUMMARY
Course Elapsed Days: 12
Plan Fractions Treated to Date: 9
Plan Prescribed Dose Per Fraction: 2.66 Gy
Plan Total Fractions Prescribed: 16
Plan Total Prescribed Dose: 42.56 Gy
Reference Point Dosage Given to Date: 23.94 Gy
Reference Point Session Dosage Given: 2.66 Gy
Session Number: 9

## 2022-08-10 ENCOUNTER — Ambulatory Visit
Admission: RE | Admit: 2022-08-10 | Discharge: 2022-08-10 | Disposition: A | Discharge status: Home / Self Care | Payer: Medicare Other | Source: Ambulatory Visit | Attending: Radiation Oncology | Admitting: Radiation Oncology

## 2022-08-10 ENCOUNTER — Other Ambulatory Visit: Payer: Self-pay

## 2022-08-10 DIAGNOSIS — Z510 Encounter for antineoplastic radiation therapy: Secondary | ICD-10-CM | POA: Diagnosis not present

## 2022-08-10 LAB — RAD ONC ARIA SESSION SUMMARY
Course Elapsed Days: 13
Plan Fractions Treated to Date: 10
Plan Prescribed Dose Per Fraction: 2.66 Gy
Plan Total Fractions Prescribed: 16
Plan Total Prescribed Dose: 42.56 Gy
Reference Point Dosage Given to Date: 26.6 Gy
Reference Point Session Dosage Given: 2.66 Gy
Session Number: 10

## 2022-08-11 ENCOUNTER — Other Ambulatory Visit: Payer: Self-pay

## 2022-08-11 ENCOUNTER — Ambulatory Visit
Admission: RE | Admit: 2022-08-11 | Discharge: 2022-08-11 | Disposition: A | Discharge status: Home / Self Care | Payer: Medicare Other | Source: Ambulatory Visit | Attending: Radiation Oncology | Admitting: Radiation Oncology

## 2022-08-11 DIAGNOSIS — Z510 Encounter for antineoplastic radiation therapy: Secondary | ICD-10-CM | POA: Diagnosis not present

## 2022-08-11 LAB — RAD ONC ARIA SESSION SUMMARY
Course Elapsed Days: 14
Plan Fractions Treated to Date: 11
Plan Prescribed Dose Per Fraction: 2.66 Gy
Plan Total Fractions Prescribed: 16
Plan Total Prescribed Dose: 42.56 Gy
Reference Point Dosage Given to Date: 29.26 Gy
Reference Point Session Dosage Given: 2.66 Gy
Session Number: 11

## 2022-08-12 ENCOUNTER — Ambulatory Visit
Admission: RE | Admit: 2022-08-12 | Discharge: 2022-08-12 | Disposition: A | Discharge status: Home / Self Care | Payer: Medicare Other | Source: Ambulatory Visit | Attending: Radiation Oncology | Admitting: Radiation Oncology

## 2022-08-12 ENCOUNTER — Other Ambulatory Visit: Payer: Self-pay

## 2022-08-12 DIAGNOSIS — Z510 Encounter for antineoplastic radiation therapy: Secondary | ICD-10-CM | POA: Diagnosis not present

## 2022-08-12 LAB — RAD ONC ARIA SESSION SUMMARY
Course Elapsed Days: 15
Plan Fractions Treated to Date: 12
Plan Prescribed Dose Per Fraction: 2.66 Gy
Plan Total Fractions Prescribed: 16
Plan Total Prescribed Dose: 42.56 Gy
Reference Point Dosage Given to Date: 31.92 Gy
Reference Point Session Dosage Given: 2.66 Gy
Session Number: 12

## 2022-08-13 ENCOUNTER — Other Ambulatory Visit: Payer: Self-pay

## 2022-08-13 ENCOUNTER — Ambulatory Visit
Admission: RE | Admit: 2022-08-13 | Discharge: 2022-08-13 | Disposition: A | Discharge status: Home / Self Care | Payer: Medicare Other | Source: Ambulatory Visit | Attending: Radiation Oncology | Admitting: Radiation Oncology

## 2022-08-13 DIAGNOSIS — Z510 Encounter for antineoplastic radiation therapy: Secondary | ICD-10-CM | POA: Diagnosis not present

## 2022-08-13 LAB — RAD ONC ARIA SESSION SUMMARY
Course Elapsed Days: 16
Plan Fractions Treated to Date: 13
Plan Prescribed Dose Per Fraction: 2.66 Gy
Plan Total Fractions Prescribed: 16
Plan Total Prescribed Dose: 42.56 Gy
Reference Point Dosage Given to Date: 34.58 Gy
Reference Point Session Dosage Given: 2.66 Gy
Session Number: 13

## 2022-08-16 ENCOUNTER — Other Ambulatory Visit: Payer: Self-pay

## 2022-08-16 ENCOUNTER — Ambulatory Visit
Admission: RE | Admit: 2022-08-16 | Discharge: 2022-08-16 | Disposition: A | Discharge status: Home / Self Care | Payer: Medicare Other | Source: Ambulatory Visit | Attending: Radiation Oncology | Admitting: Radiation Oncology

## 2022-08-16 DIAGNOSIS — Z510 Encounter for antineoplastic radiation therapy: Secondary | ICD-10-CM | POA: Diagnosis not present

## 2022-08-16 LAB — RAD ONC ARIA SESSION SUMMARY
Course Elapsed Days: 19
Plan Fractions Treated to Date: 14
Plan Prescribed Dose Per Fraction: 2.66 Gy
Plan Total Fractions Prescribed: 16
Plan Total Prescribed Dose: 42.56 Gy
Reference Point Dosage Given to Date: 37.24 Gy
Reference Point Session Dosage Given: 2.66 Gy
Session Number: 14

## 2022-08-17 ENCOUNTER — Other Ambulatory Visit: Payer: Self-pay

## 2022-08-17 ENCOUNTER — Ambulatory Visit
Admission: RE | Admit: 2022-08-17 | Discharge: 2022-08-17 | Disposition: A | Discharge status: Home / Self Care | Payer: Medicare Other | Source: Ambulatory Visit | Attending: Radiation Oncology | Admitting: Radiation Oncology

## 2022-08-17 DIAGNOSIS — Z510 Encounter for antineoplastic radiation therapy: Secondary | ICD-10-CM | POA: Diagnosis not present

## 2022-08-17 LAB — RAD ONC ARIA SESSION SUMMARY
Course Elapsed Days: 20
Plan Fractions Treated to Date: 15
Plan Prescribed Dose Per Fraction: 2.66 Gy
Plan Total Fractions Prescribed: 16
Plan Total Prescribed Dose: 42.56 Gy
Reference Point Dosage Given to Date: 39.9 Gy
Reference Point Session Dosage Given: 2.66 Gy
Session Number: 15

## 2022-08-18 ENCOUNTER — Inpatient Hospital Stay: Payer: Medicare Other

## 2022-08-18 ENCOUNTER — Other Ambulatory Visit: Payer: Self-pay

## 2022-08-18 ENCOUNTER — Ambulatory Visit
Admission: RE | Admit: 2022-08-18 | Discharge: 2022-08-18 | Disposition: A | Discharge status: Home / Self Care | Payer: Medicare Other | Source: Ambulatory Visit | Attending: Radiation Oncology | Admitting: Radiation Oncology

## 2022-08-18 DIAGNOSIS — Z510 Encounter for antineoplastic radiation therapy: Secondary | ICD-10-CM | POA: Diagnosis not present

## 2022-08-18 DIAGNOSIS — C50922 Malignant neoplasm of unspecified site of left male breast: Secondary | ICD-10-CM

## 2022-08-18 LAB — CBC
HCT: 48.5 % (ref 39.0–52.0)
Hemoglobin: 16.4 g/dL (ref 13.0–17.0)
MCH: 31.5 pg (ref 26.0–34.0)
MCHC: 33.8 g/dL (ref 30.0–36.0)
MCV: 93.1 fL (ref 80.0–100.0)
Platelets: 200 10*3/uL (ref 150–400)
RBC: 5.21 MIL/uL (ref 4.22–5.81)
RDW: 13.8 % (ref 11.5–15.5)
WBC: 7.2 10*3/uL (ref 4.0–10.5)
nRBC: 0 % (ref 0.0–0.2)

## 2022-08-18 LAB — RAD ONC ARIA SESSION SUMMARY
Course Elapsed Days: 21
Plan Fractions Treated to Date: 16
Plan Prescribed Dose Per Fraction: 2.66 Gy
Plan Total Fractions Prescribed: 16
Plan Total Prescribed Dose: 42.56 Gy
Reference Point Dosage Given to Date: 42.56 Gy
Reference Point Session Dosage Given: 2.66 Gy
Session Number: 16

## 2022-08-19 ENCOUNTER — Other Ambulatory Visit: Payer: Self-pay

## 2022-08-19 ENCOUNTER — Ambulatory Visit
Admission: RE | Admit: 2022-08-19 | Discharge: 2022-08-19 | Disposition: A | Discharge status: Home / Self Care | Payer: Medicare Other | Source: Ambulatory Visit | Attending: Radiation Oncology | Admitting: Radiation Oncology

## 2022-08-19 DIAGNOSIS — Z510 Encounter for antineoplastic radiation therapy: Secondary | ICD-10-CM | POA: Diagnosis not present

## 2022-08-19 LAB — RAD ONC ARIA SESSION SUMMARY
Course Elapsed Days: 22
Plan Fractions Treated to Date: 1
Plan Prescribed Dose Per Fraction: 2 Gy
Plan Total Fractions Prescribed: 5
Plan Total Prescribed Dose: 10 Gy
Reference Point Dosage Given to Date: 2 Gy
Reference Point Session Dosage Given: 2 Gy
Session Number: 17

## 2022-08-20 ENCOUNTER — Ambulatory Visit
Admission: RE | Admit: 2022-08-20 | Discharge: 2022-08-20 | Disposition: A | Discharge status: Home / Self Care | Payer: Medicare Other | Source: Ambulatory Visit | Attending: Radiation Oncology | Admitting: Radiation Oncology

## 2022-08-20 ENCOUNTER — Other Ambulatory Visit: Payer: Self-pay

## 2022-08-20 DIAGNOSIS — Z510 Encounter for antineoplastic radiation therapy: Secondary | ICD-10-CM | POA: Diagnosis not present

## 2022-08-20 LAB — RAD ONC ARIA SESSION SUMMARY
Course Elapsed Days: 23
Plan Fractions Treated to Date: 2
Plan Prescribed Dose Per Fraction: 2 Gy
Plan Total Fractions Prescribed: 5
Plan Total Prescribed Dose: 10 Gy
Reference Point Dosage Given to Date: 4 Gy
Reference Point Session Dosage Given: 2 Gy
Session Number: 18

## 2022-08-23 ENCOUNTER — Other Ambulatory Visit: Payer: Self-pay

## 2022-08-23 ENCOUNTER — Ambulatory Visit
Admission: RE | Admit: 2022-08-23 | Discharge: 2022-08-23 | Disposition: A | Discharge status: Home / Self Care | Payer: Medicare Other | Source: Ambulatory Visit | Attending: Radiation Oncology | Admitting: Radiation Oncology

## 2022-08-23 DIAGNOSIS — Z510 Encounter for antineoplastic radiation therapy: Secondary | ICD-10-CM | POA: Diagnosis not present

## 2022-08-23 LAB — RAD ONC ARIA SESSION SUMMARY
Course Elapsed Days: 26
Plan Fractions Treated to Date: 3
Plan Prescribed Dose Per Fraction: 2 Gy
Plan Total Fractions Prescribed: 5
Plan Total Prescribed Dose: 10 Gy
Reference Point Dosage Given to Date: 6 Gy
Reference Point Session Dosage Given: 2 Gy
Session Number: 19

## 2022-08-24 ENCOUNTER — Other Ambulatory Visit: Payer: Self-pay

## 2022-08-24 ENCOUNTER — Encounter: Payer: Self-pay | Admitting: *Deleted

## 2022-08-24 ENCOUNTER — Ambulatory Visit
Admission: RE | Admit: 2022-08-24 | Discharge: 2022-08-24 | Disposition: A | Discharge status: Home / Self Care | Payer: Medicare Other | Source: Ambulatory Visit | Attending: Radiation Oncology | Admitting: Radiation Oncology

## 2022-08-24 ENCOUNTER — Other Ambulatory Visit: Payer: Self-pay | Admitting: *Deleted

## 2022-08-24 DIAGNOSIS — Z510 Encounter for antineoplastic radiation therapy: Secondary | ICD-10-CM | POA: Diagnosis not present

## 2022-08-24 LAB — RAD ONC ARIA SESSION SUMMARY
Course Elapsed Days: 27
Plan Fractions Treated to Date: 4
Plan Prescribed Dose Per Fraction: 2 Gy
Plan Total Fractions Prescribed: 5
Plan Total Prescribed Dose: 10 Gy
Reference Point Dosage Given to Date: 8 Gy
Reference Point Session Dosage Given: 2 Gy
Session Number: 20

## 2022-08-24 MED ORDER — SILVER SULFADIAZINE 1 % EX CREA: 1.0000 | TOPICAL_CREAM | Freq: Two times a day (BID) | CUTANEOUS | 0 refills | Status: DC

## 2022-08-24 MED ORDER — TAMOXIFEN CITRATE 20 MG PO TABS: 20.0000 mg | ORAL_TABLET | Freq: Every day | ORAL | 5 refills | Status: DC

## 2022-08-24 NOTE — Progress Notes (Signed)
[name] finishes radiation tomorrow.   Tamoxifen has been sent to [facility].  Patient given instructions to start medication after he finishes radiation this week.

## 2022-08-25 ENCOUNTER — Ambulatory Visit
Admission: RE | Admit: 2022-08-25 | Discharge: 2022-08-25 | Disposition: A | Discharge status: Home / Self Care | Payer: Medicare Other | Source: Ambulatory Visit | Attending: Radiation Oncology | Admitting: Radiation Oncology

## 2022-08-25 ENCOUNTER — Other Ambulatory Visit: Payer: Self-pay

## 2022-08-25 DIAGNOSIS — Z510 Encounter for antineoplastic radiation therapy: Secondary | ICD-10-CM | POA: Diagnosis not present

## 2022-08-25 LAB — RAD ONC ARIA SESSION SUMMARY
Course Elapsed Days: 28
Plan Fractions Treated to Date: 5
Plan Prescribed Dose Per Fraction: 2 Gy
Plan Total Fractions Prescribed: 5
Plan Total Prescribed Dose: 10 Gy
Reference Point Dosage Given to Date: 10 Gy
Reference Point Session Dosage Given: 2 Gy
Session Number: 21

## 2022-09-29 ENCOUNTER — Ambulatory Visit
Admission: RE | Admit: 2022-09-29 | Discharge: 2022-09-29 | Disposition: A | Discharge status: Home / Self Care | Payer: Medicare Other | Source: Ambulatory Visit | Attending: Radiation Oncology | Admitting: Radiation Oncology

## 2022-09-29 ENCOUNTER — Encounter: Payer: Self-pay | Admitting: Radiation Oncology

## 2022-09-29 VITALS — BP 125/87 | HR 88 | Temp 98.0°F | Resp 18 | Ht 67.0 in | Wt 188.0 lb

## 2022-09-29 DIAGNOSIS — C50922 Malignant neoplasm of unspecified site of left male breast: Secondary | ICD-10-CM | POA: Insufficient documentation

## 2022-09-29 NOTE — Progress Notes (Signed)
Radiation Oncology Follow up Note  Name: Bernadette Armijo   Date:   09/29/2022 MRN:  [id number] DOB: 07/13/1934    This 88 y.o. male presents to the clinic today for 1 month follow-up status post radiation therapy to the left chest wall for stage IIa ER positive invasive mammary carcinoma of the left male breast.  REFERRING PROVIDER: Kirk Ruths, MD  HPI: Patient is a 88 year old male now at 1 month having completed left chest wall radiation status post mastectomy for stage IIa ER positive invasive mammary carcinoma the left breast seen today in routine follow-up he is doing well.  His skin is somewhat a little tender although improving.  He otherwise specifically denies cough or bone pain.  Patient is currently on tamoxifen and tolerating it well COMPLICATIONS OF TREATMENT: none  FOLLOW UP COMPLIANCE: keeps appointments   PHYSICAL EXAM:  BP 125/87 (BP Location: Right Arm, Patient Position: Sitting, Cuff Size: Normal)   Pulse 88   Temp 98 F (36.7 C) (Tympanic)   Resp 18   Ht '5\' 7"'$  (1.702 m) Comment: stated ht  Wt 188 lb (85.3 kg)   BMI 29.44 kg/m  He is status post mastectomy.  Still slight hyperpigmentation and erythematous change of the skin although that is improving.  No evidence of mass or nodularity in his left chest wall is noted.  No axillary or supraclavicular adenopathy is appreciated.  Well-developed well-nourished patient in NAD. HEENT reveals PERLA, EOMI, discs not visualized.  Oral cavity is clear. No oral mucosal lesions are identified. Neck is clear without evidence of cervical or supraclavicular adenopathy. Lungs are clear to A&P. Cardiac examination is essentially unremarkable with regular rate and rhythm without murmur rub or thrill. Abdomen is benign with no organomegaly or masses noted. Motor sensory and DTR levels are equal and symmetric in the upper and lower extremities. Cranial nerves II through XII are grossly intact. Proprioception is intact. No peripheral  adenopathy or edema is identified. No motor or sensory levels are noted. Crude visual fields are within normal range.  RADIOLOGY RESULTS: No current films for review  PLAN: Present time patient is doing well 1 month out from chest wall radiation and pleased with his overall progress.  I have asked to see him back in 6 months for follow-up.  Patient is to call with any concerns.  Patient continues on tamoxifen without side effect.  I would like to take this opportunity to thank you for allowing me to participate in the care of your patient.Noreene Filbert, MD

## 2022-10-07 ENCOUNTER — Encounter: Payer: Self-pay | Admitting: Oncology

## 2022-10-07 ENCOUNTER — Inpatient Hospital Stay: Payer: Medicare Other | Attending: Oncology | Admitting: Oncology

## 2022-10-07 VITALS — BP 132/84 | HR 74 | Temp 97.0°F | Resp 18

## 2022-10-07 DIAGNOSIS — N189 Chronic kidney disease, unspecified: Secondary | ICD-10-CM | POA: Diagnosis not present

## 2022-10-07 DIAGNOSIS — Z923 Personal history of irradiation: Secondary | ICD-10-CM | POA: Insufficient documentation

## 2022-10-07 DIAGNOSIS — Z79810 Long term (current) use of selective estrogen receptor modulators (SERMs): Secondary | ICD-10-CM | POA: Insufficient documentation

## 2022-10-07 DIAGNOSIS — Z170 Estrogen receptor positive status [ER+]: Secondary | ICD-10-CM | POA: Diagnosis not present

## 2022-10-07 DIAGNOSIS — Z87891 Personal history of nicotine dependence: Secondary | ICD-10-CM | POA: Diagnosis not present

## 2022-10-07 DIAGNOSIS — Z800 Family history of malignant neoplasm of digestive organs: Secondary | ICD-10-CM | POA: Insufficient documentation

## 2022-10-07 DIAGNOSIS — C50922 Malignant neoplasm of unspecified site of left male breast: Secondary | ICD-10-CM | POA: Insufficient documentation

## 2022-10-07 NOTE — Progress Notes (Signed)
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  INTERVAL HISTORY: Patient returns to clinic today for further evaluation and assess for toleration of tamoxifen.  He complains of erectile dysfunction, but otherwise feels well.  He is tolerating his treatment without significant side effects.  He has no neurologic complaints.  He denies any recent fevers or illnesses.  He has a good appetite and denies weight loss.  He has no chest pain, shortness of breath, cough, or hemoptysis.  He denies any nausea, vomiting, constipation, or diarrhea.  He has no urinary complaints.  Patient offers no further specific complaints today.  REVIEW OF SYSTEMS:   Review of Systems  Constitutional: Negative.  Negative for fever, malaise/fatigue and weight loss.  Respiratory: Negative.  Negative for cough, hemoptysis and shortness of breath.   Cardiovascular: Negative.  Negative for chest pain and leg swelling.  Gastrointestinal: Negative.  Negative for abdominal pain.  Genitourinary: Negative.  Negative for dysuria.  Musculoskeletal: Negative.  Negative for back pain.  Skin: Negative.  Negative for rash.  Neurological: Negative.  Negative for dizziness, focal weakness, weakness and headaches.  Psychiatric/Behavioral: Negative.  The patient is not nervous/anxious.     As per HPI. Otherwise, a complete review of systems is negative.  PAST MEDICAL HISTORY: Past Medical History:  Diagnosis Date   A-fib ([place])    Arthritis    Cataract cortical, senile    Chronic kidney disease    Colon polyps    Foraminal stenosis of lumbar region    Hypercholesteremia    Neuropathy    BOTH FEET   Sciatic pain, right    Tubular adenoma of colon      PAST SURGICAL HISTORY: Past Surgical History:  Procedure Laterality Date   BREAST BIOPSY Left 06/02/2022   [place] Core Bx, Ribbon clip- path pending   COLONOSCOPY WITH PROPOFOL N/A 02/27/2016   Procedure: COLONOSCOPY WITH PROPOFOL;  Surgeon: Josefine Class, MD;  Location: [facility] ENDOSCOPY;  Service: Endoscopy;  Laterality: N/A;   SIMPLE MASTECTOMY WITH AXILLARY SENTINEL NODE BIOPSY Left 06/21/2022   Procedure: SIMPLE MASTECTOMY WITH AXILLARY SENTINEL NODE BIOPSY;  Surgeon: Herbert Pun, MD;  Location: [facility] ORS;  Service: General;  Laterality: Left;   UPPER GASTROINTESTINAL ENDOSCOPY  2017    FAMILY HISTORY: Family History  Problem Relation Age of Onset   Pancreatic cancer Maternal Aunt    Esophageal cancer Son 66   Cancer Son        primary not determined, d. 38   Cancer Niece        unk type   Cancer Nephew        unk type    ADVANCED DIRECTIVES (Y/N):  N  HEALTH MAINTENANCE: Social History   Tobacco Use   Smoking status: Former    Types: Cigarettes    Quit date: 1960    Years since quitting: 63.9   Smokeless tobacco: Never  Vaping Use   Vaping Use: Never used  Substance Use Topics   Alcohol use: Not Currently    Alcohol/week: 7.0 standard drinks of alcohol    Types: 7 Standard drinks or equivalent per week    Comment: 7 drinks per week in week   Drug use: Never     Colonoscopy:  PAP:  Bone density:  Lipid panel:  No Known Allergies  Current Outpatient Medications  Medication Sig Dispense Refill   Alpha Lipoic Acid 200 MG CAPS Take 1 capsule by mouth daily.     aspirin 325 MG tablet Take 325 mg by mouth every 6 (six) hours as needed.     atorvastatin (LIPITOR) 10 MG tablet Take 1 tablet by mouth daily.     Cholecalciferol (VITAMIN D-1000 MAX ST) 25 MCG (1000 UT) tablet Take by mouth.     cyanocobalamin (VITAMIN B12) 1000 MCG tablet Take by mouth.     ipratropium (ATROVENT) 0.03 % nasal spray Place into both nostrils.     pregabalin (LYRICA)  50 MG capsule Take 50 mg by mouth 2 (two) times daily.     pyridoxine (B-6) 100 MG tablet Take 100 mg by mouth daily.     tamoxifen (NOLVADEX) 20 MG tablet Take 1 tablet (20 mg total) by mouth daily. 30 tablet 5   neomycin-polymyxin b-dexamethasone (MAXITROL) 3.5-10000-0.1 OINT APPLY A SMALL AMOUNT ON THE EYELID TWICE DAILY (Patient not taking: Reported on 10/07/2022)     No current facility-administered medications for this visit.    OBJECTIVE: Vitals:   10/07/22 1038  BP: 132/84  Pulse: 74  Resp: 18  Temp: (!) 97 F (36.1 C)  SpO2: 99%     There is no height or weight on file to calculate BMI.    ECOG FS:0 - Asymptomatic  General: Well-developed, well-nourished, no acute distress. Eyes: Pink conjunctiva, anicteric sclera. HEENT: Normocephalic, moist mucous membranes. Breast: Left mastectomy scar. Lungs: No audible wheezing or coughing. Heart: Regular rate and rhythm. Abdomen: Soft, nontender, no obvious distention. Musculoskeletal: No edema, cyanosis, or clubbing. Neuro: Alert, answering all questions appropriately. Cranial nerves grossly intact. Skin: No rashes or petechiae noted. Psych: Normal affect.   LAB RESULTS:  Lab Results  Component Value Date   NA 138 06/16/2022   K 3.6 06/16/2022   CL 107 06/16/2022   CO2 22 06/16/2022   GLUCOSE 132 (H) 06/16/2022   BUN 17 06/16/2022   CREATININE 1.19 06/16/2022   CALCIUM 8.9 06/16/2022   PROT 7.2 12/13/2018   ALBUMIN 4.5 12/13/2018   AST 66 (H) 12/13/2018   ALT 39 12/13/2018   ALKPHOS 68 12/13/2018   BILITOT 1.3 (H) 12/13/2018   GFRNONAA 60 (L) 06/16/2022   GFRAA 59 (L) 12/13/2018    Lab Results  Component Value Date   WBC 7.2 08/18/2022   NEUTROABS 4.5 12/13/2018   HGB 16.4 08/18/2022   HCT 48.5 08/18/2022   MCV 93.1 08/18/2022   PLT 200 08/18/2022     STUDIES: No results found.  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  PLAN:    Stage IIa ER/PR  positive, HER2 negative invasive carcinoma of the left breast: Per NCCN guidelines, treatment for male cancer can be extrapolated through the recommended treatment for male patients.  He underwent simple mastectomy on June 21, 2022.  Although he is a stage II, his Oncotype score was considered low risk therefore chemotherapy is not necessary.  Given his advanced age, patient was also hesitant to pursue systemic treatment.  He completed XRT in November 2023.  Recommendation is to take tamoxifen for a total of 5 years completing treatment in November 2028.  No intervention is needed at this time.  Return to clinic in 6 months for routine evaluation.   Genetics: No pathogenic variants identified.  Appreciate genetics input.  Patient expressed understanding and was in agreement with this plan. He also understands that He can call clinic at any time with any questions, concerns, or complaints.    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

## 2022-10-07 NOTE — Progress Notes (Signed)
Diminished stamina. Denies any SE from Tamoxifen. Appetite is good. Intermittent shooting pain felt in L breast area. Has neuropathy.

## 2022-10-17 IMAGING — MR MR ABDOMEN WO/W CM
18 series · 48 of 48 positions shown · IV contrast (gadavist)
Comparison: 08/12/2021

CLINICAL DATA: Characterize cystic liver lesion identified by
ultrasound

EXAM:
MRI ABDOMEN WITHOUT AND WITH CONTRAST
TECHNIQUE: Multiplanar multisequence MR imaging of the abdomen was performed
both before and after the administration of intravenous contrast.
CONTRAST:  7.5mL GADAVIST GADOBUTROL 1 MMOL/ML IV SOLN

[Series 3: T2 · coronal · 6.0mm · 1.19mm/px · 2 of 30 slices shown (1 of 2)]
[im 1/30]
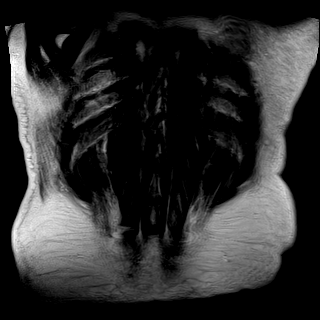
[im 30/30]
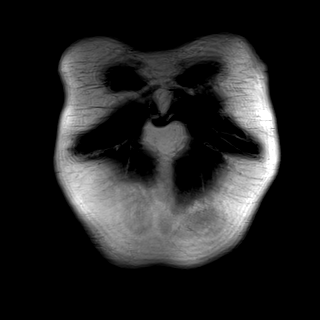

[Series 4: T2 · axial · 6.0mm · 1.19mm/px · z∈[-107,+145]mm · 2 of 36 slices shown (2 of 2)]
[im 1/36]
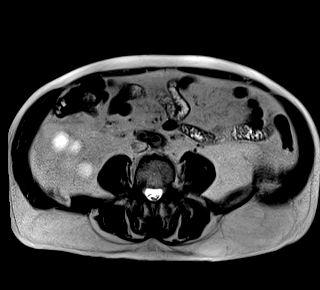
[im 36/36]
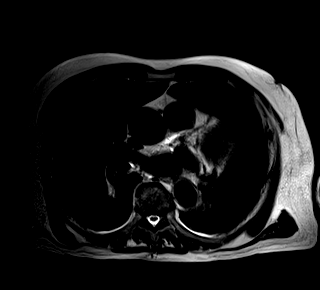

[Series 6: T2 fat-sat · axial · 6.0mm · 1.19mm/px · z∈[-118,+156]mm · 2 of 39 slices shown]
[im 1/39]
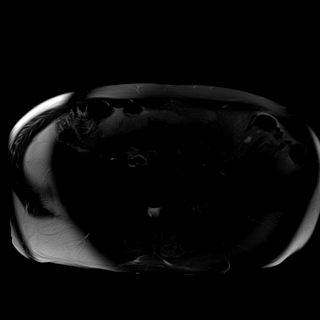
[im 39/39]
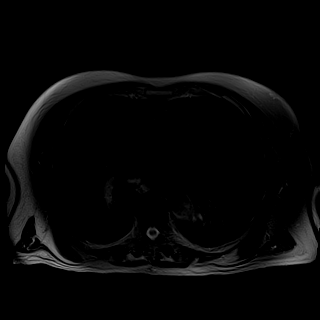

[Series 7: ax dwi_tracew · axial · 6.0mm · 1.42mm/px · z∈[-121,+159]mm · 4 of 120 slices shown]
[im 1/120]
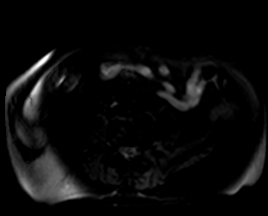
[im 40/120]
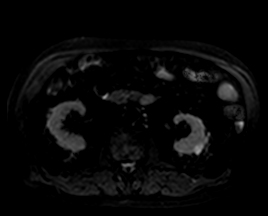
[im 80/120]
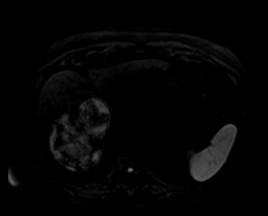
[im 120/120]
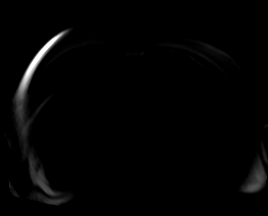

[Series 8: ax dwi_adc · axial · 6.0mm · 1.42mm/px · 1 of 40 slices shown]
[im 1/40]
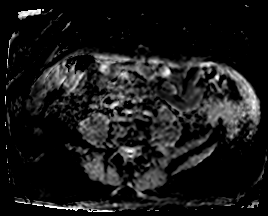

[Series 9: in & out · axial · 3.0mm · 1.19mm/px · z∈[-111,+149]mm · 3 of 88 slices shown (1 of 2)]
[im 1/88]
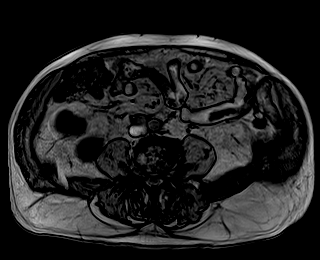
[im 44/88]
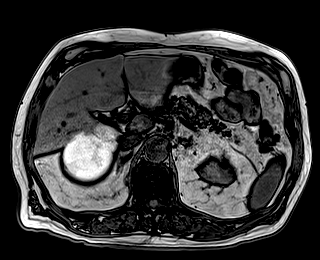
[im 88/88]
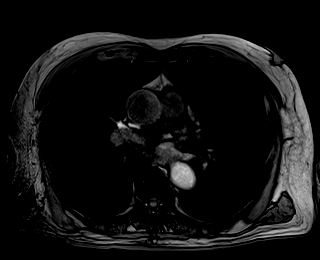

[Series 10: in & out · axial · 3.0mm · 1.19mm/px · z∈[-111,+149]mm · 3 of 88 slices shown (2 of 2)]
[im 1/88]
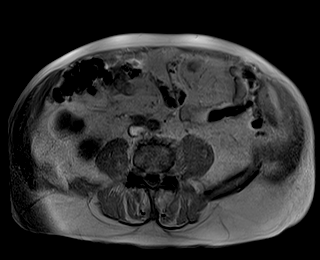
[im 44/88]
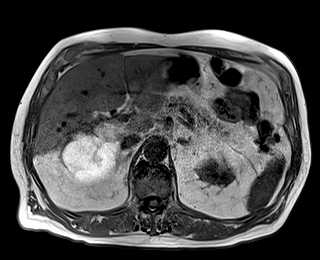
[im 88/88]
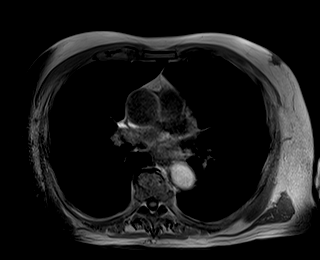

[Series 11: bSSFP · axial · 6.0mm · 0.74mm/px · 1 of 36 slices shown]
[im 1/36]
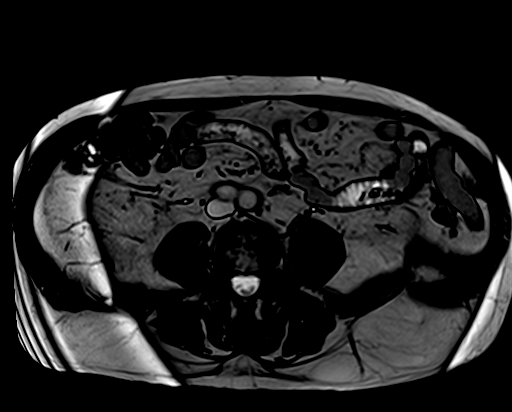

[Series 12: T1 dynamic fat-sat · axial · non-contrast · 3.0mm · 1.19mm/px · z∈[-111,+149]mm · 3 of 88 slices shown (1 of 5)]
[im 1/88]
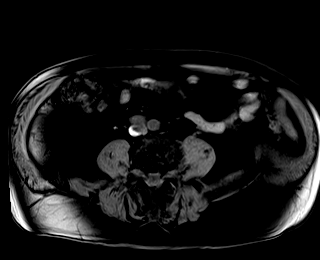
[im 44/88]
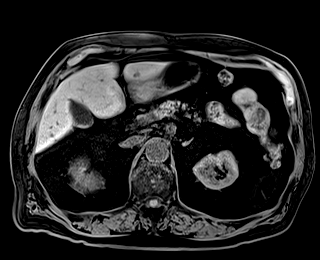
[im 88/88]
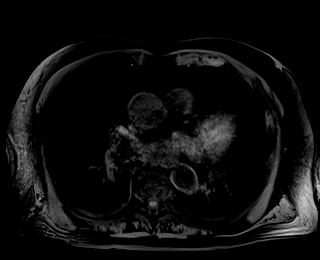

[Series 13: T1 dynamic fat-sat post-contrast · axial · 3.0mm · 1.19mm/px · z∈[-111,+149]mm · 3 of 88 slices shown (1 of 4)]
[im 1/88]
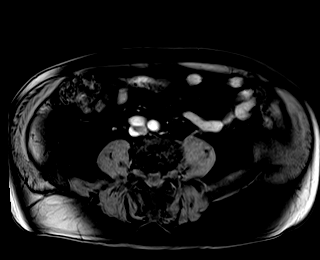
[im 44/88]
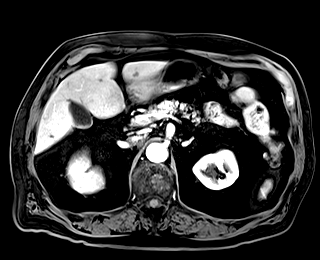
[im 88/88]
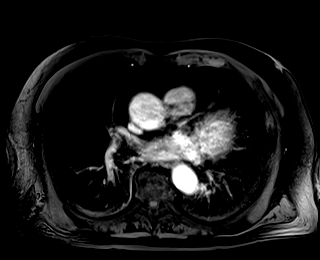

[Series 14: T1 dynamic fat-sat · axial · 3.0mm · 1.19mm/px · z∈[-111,+149]mm · 3 of 88 slices shown (2 of 5)]
[im 1/88]
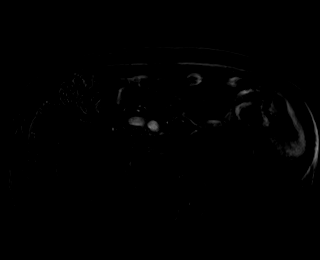
[im 44/88]
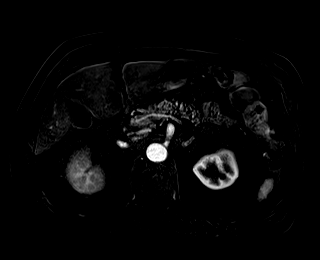
[im 88/88]
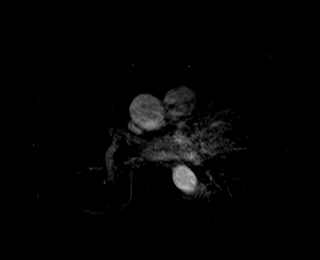

[Series 15: T1 dynamic fat-sat post-contrast · axial · 3.0mm · 1.19mm/px · z∈[-111,+149]mm · 3 of 88 slices shown (2 of 4)]
[im 1/88]
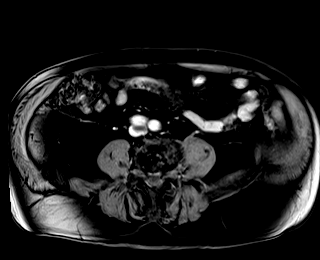
[im 44/88]
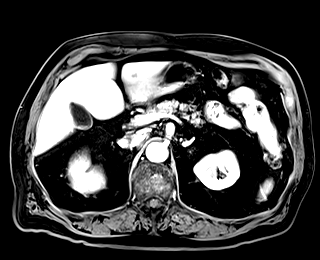
[im 88/88]
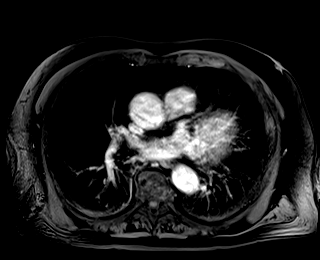

[Series 16: T1 dynamic fat-sat · axial · 3.0mm · 1.19mm/px · z∈[-111,+149]mm · 3 of 88 slices shown (3 of 5)]
[im 1/88]
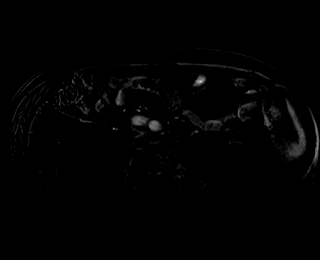
[im 44/88]
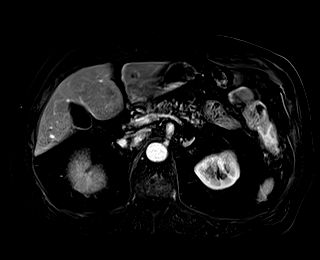
[im 88/88]
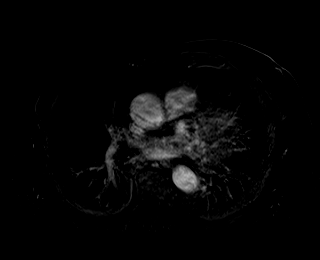

[Series 17: T1 dynamic fat-sat post-contrast · axial · 3.0mm · 1.19mm/px · z∈[-111,+149]mm · 3 of 88 slices shown (3 of 4)]
[im 1/88]
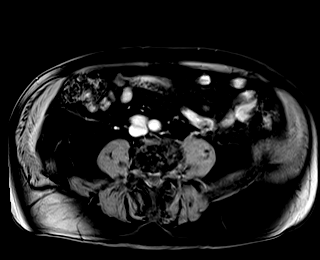
[im 44/88]
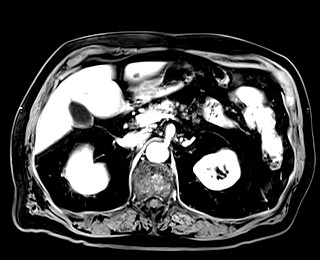
[im 88/88]
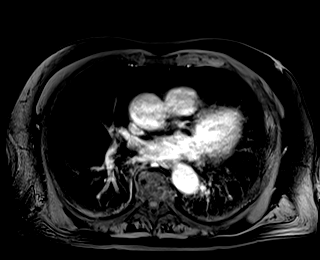

[Series 18: T1 dynamic fat-sat · axial · 3.0mm · 1.19mm/px · z∈[-111,+149]mm · 3 of 88 slices shown (4 of 5)]
[im 1/88]
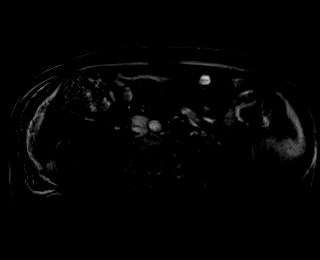
[im 44/88]
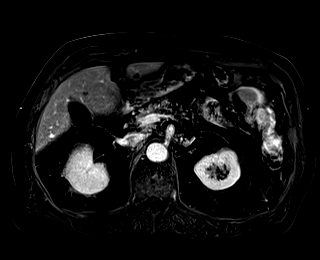
[im 88/88]
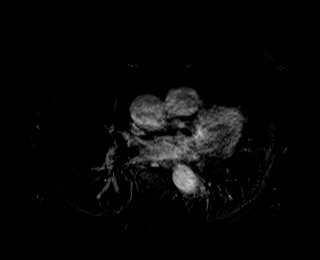

[Series 19: T1 dynamic post-contrast · coronal · 3.0mm · 1.31mm/px · 3 of 72 slices shown]
[im 1/72]
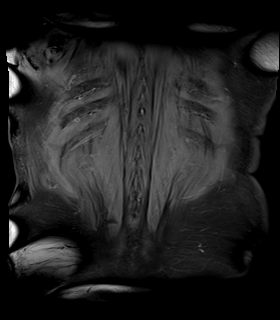
[im 36/72]
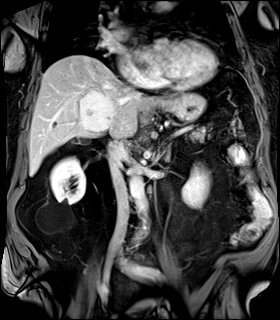
[im 72/72]
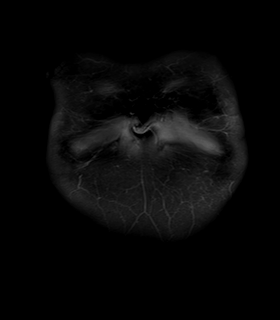

[Series 20: T1 dynamic fat-sat post-contrast · axial · 3.0mm · 1.19mm/px · z∈[-111,+149]mm · 3 of 88 slices shown (4 of 4)]
[im 1/88]
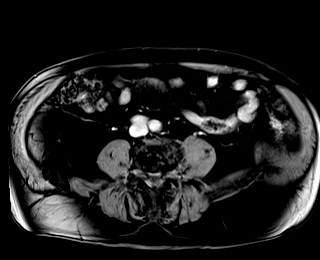
[im 44/88]
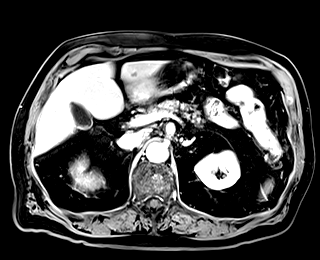
[im 88/88]
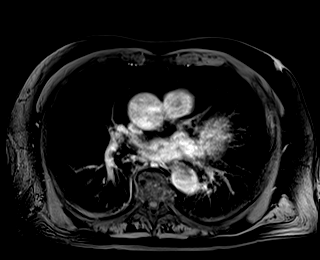

[Series 21: T1 dynamic fat-sat · axial · 3.0mm · 1.19mm/px · z∈[-111,+149]mm · 3 of 88 slices shown (5 of 5)]
[im 1/88]
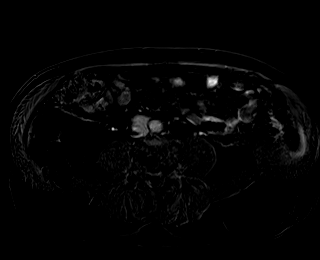
[im 44/88]
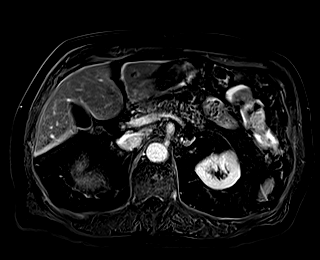
[im 88/88]
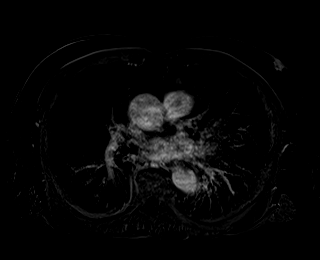

[48 of 48 positions shown; findings below may reference images not displayed]

FINDINGS: Lower chest: No acute findings.

Hepatobiliary: There is a large cystic lesion of the posterior right
lobe of the liver and liver dome, hepatic segments VI/VII, which
demonstrates heterogeneous intrinsic T1 hyperintensity and measures
11.1 x 8.5 cm (series 12, image 28). There is no associated contrast
enhancement. There are numerous additional subcentimeter fluid
attenuation lesions throughout the liver, incompletely characterized
although almost certainly tiny cysts. No solid mass or other
parenchymal abnormality identified. Tiny gallstones in the dependent
gallbladder. No gallbladder wall thickening or biliary ductal
dilatation.

Pancreas: No mass, inflammatory changes, or other parenchymal
abnormality identified.

Spleen:  Within normal limits in size and appearance.

Adrenals/Urinary Tract: No masses identified. Benign exophytic cysts
of the bilateral kidneys. No evidence of hydronephrosis.

Stomach/Bowel: Visualized portions within the abdomen are
unremarkable.

Vascular/Lymphatic: No pathologically enlarged lymph nodes
identified. No abdominal aortic aneurysm demonstrated.

Other:  None.

Musculoskeletal: No suspicious bone lesions identified.
IMPRESSION: 1. There is a large cystic lesion of the posterior right lobe of the
liver and liver dome, hepatic segments VI/VII, which demonstrates
heterogeneous intrinsic T1 hyperintensity and measures 11.1 x
cm. There is no associated contrast enhancement. This is consistent
with a hemorrhagic or proteinaceous cyst, and may be sequelae of
prior cyst hemorrhage or trauma. This is a benign lesion and no
further follow-up or characterization is required.
2. There are numerous additional subcentimeter fluid attenuation
lesions throughout the liver, incompletely characterized although
almost certainly tiny benign cysts. No solid mass or other
parenchymal abnormality identified.
3. Cholelithiasis.

## 2022-10-17 IMAGING — MR MR LUMBAR SPINE W/O CM
5 series · 31 of 48 positions shown · non-contrast
Comparison: 09/01/2020

CLINICAL DATA: Low back pain and bilateral hip pain with foot
neuropathy.

EXAM:
MRI LUMBAR SPINE WITHOUT CONTRAST
TECHNIQUE: Multiplanar, multisequence MR imaging of the lumbar spine was
performed. No intravenous contrast was administered.

[Series 5: T2 · sagittal · 4.0mm · 0.81mm/px · 6 of 17 slices shown (1 of 2)]
[im 1/17]
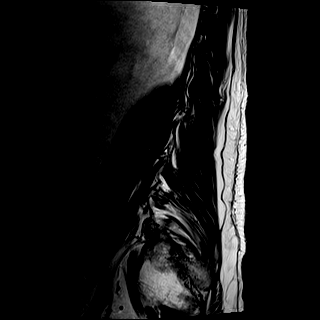
[im 4/17]
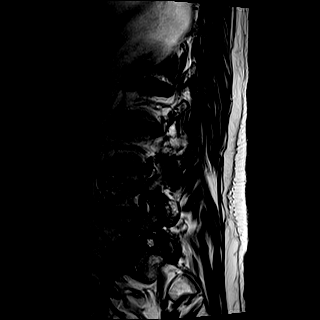
[im 7/17]
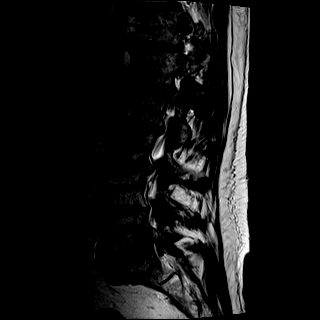
[im 10/17]
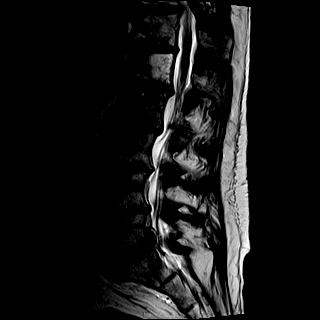
[im 13/17]
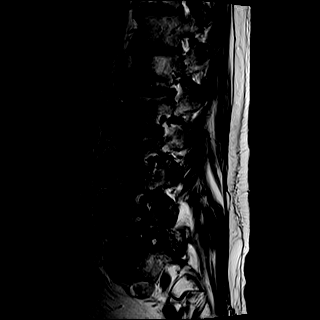
[im 17/17]
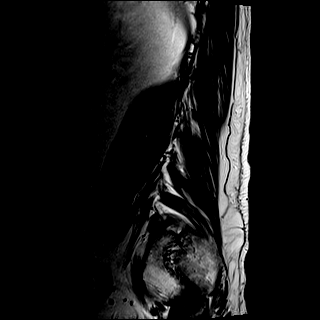

[Series 6: T1 · sagittal · 4.0mm · 0.81mm/px · 7 of 17 slices shown (1 of 2)]
[im 1/17]
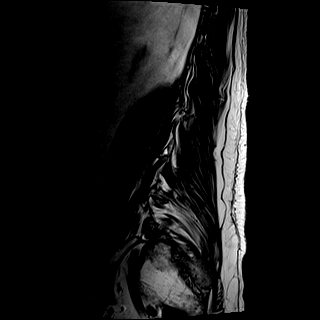
[im 3/17]
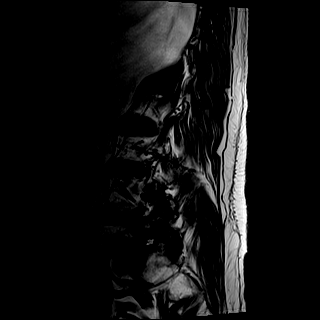
[im 6/17]
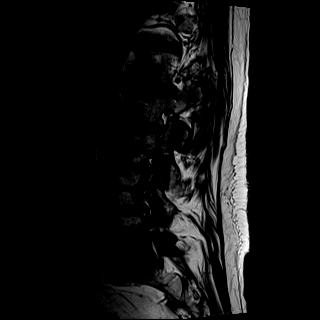
[im 9/17]
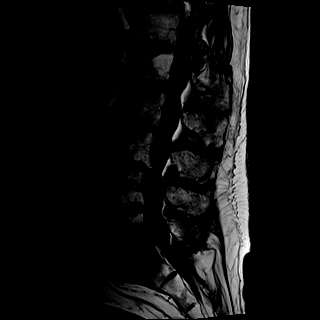
[im 11/17]
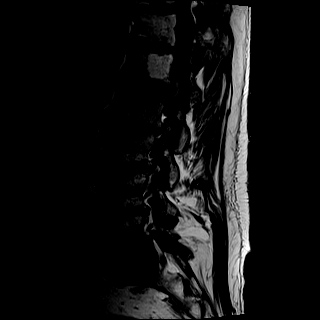
[im 14/17]
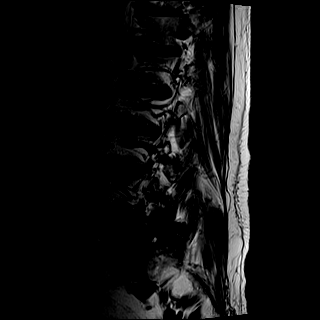
[im 17/17]
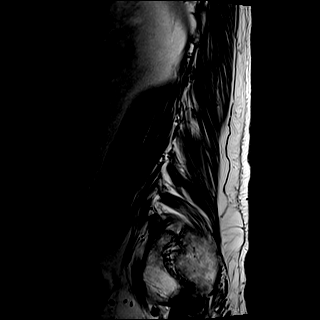

[Series 7: STIR · sagittal · 4.0mm · 0.41mm/px · 2 of 17 slices shown]
[im 1/17]
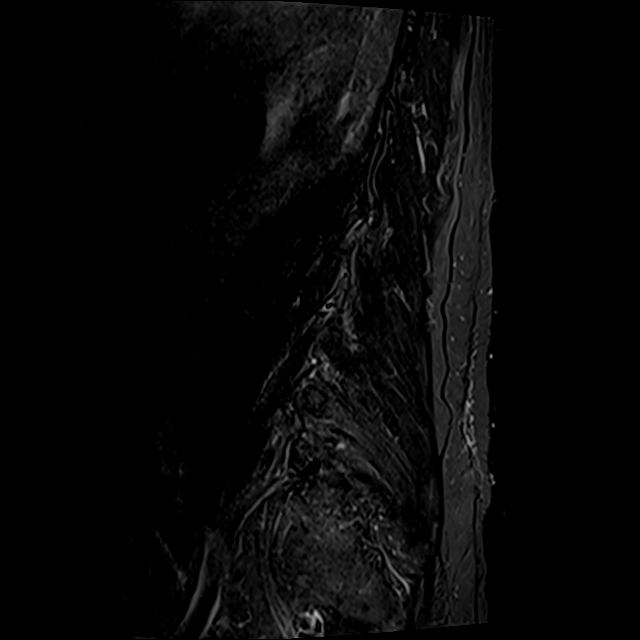
[im 3/17]
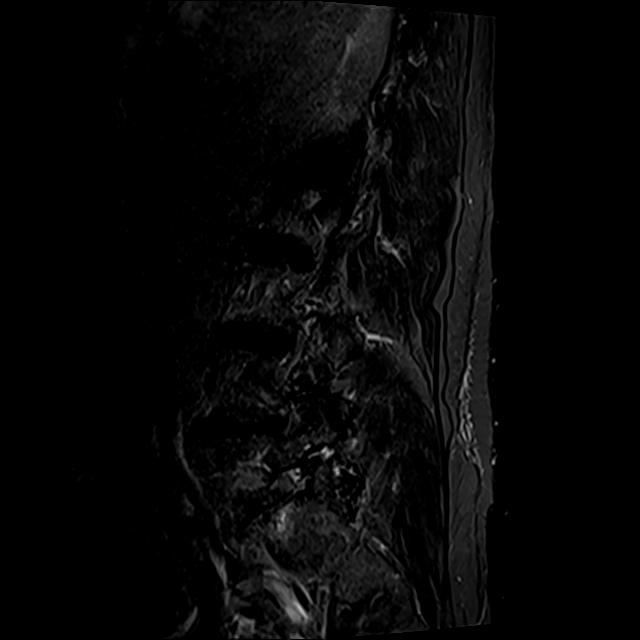

[Series 8: T2 · axial · 4.0mm · 0.78mm/px · z∈[-91,+120]mm · 8 of 36 slices shown (2 of 2)]
[im 1/36]
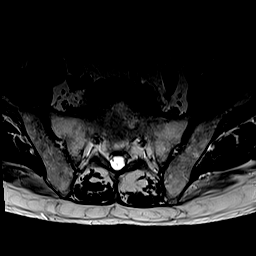
[im 6/36]
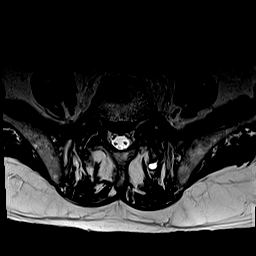
[im 11/36]
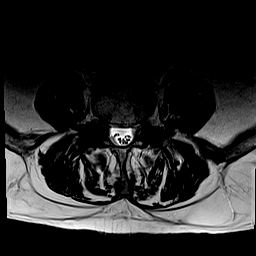
[im 17/36]
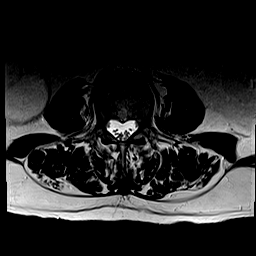
[im 19/36]
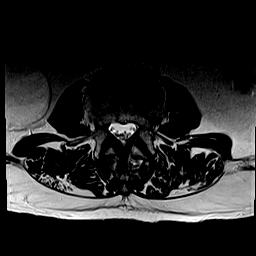
[im 25/36]
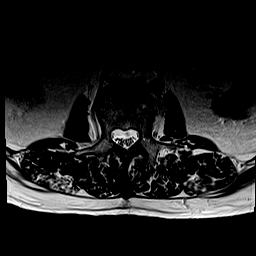
[im 30/36]
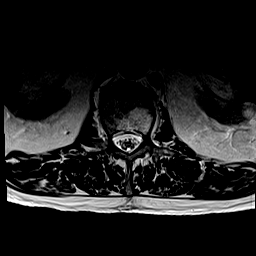
[im 36/36]
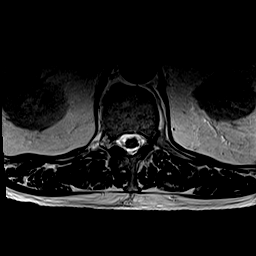

[Series 9: T1 · axial · 4.0mm · 0.39mm/px · z∈[-91,+120]mm · 8 of 36 slices shown (2 of 2)]
[im 1/36]
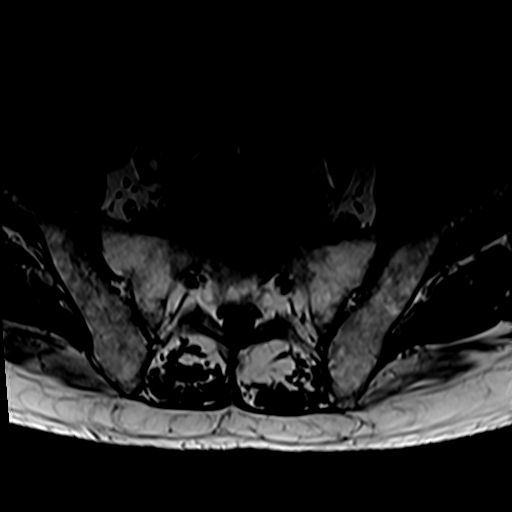
[im 6/36]
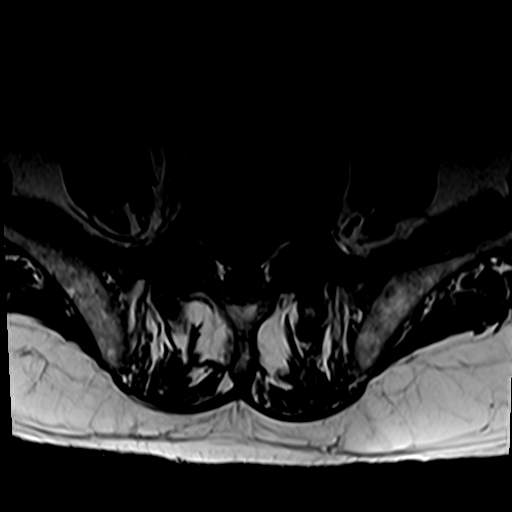
[im 11/36]
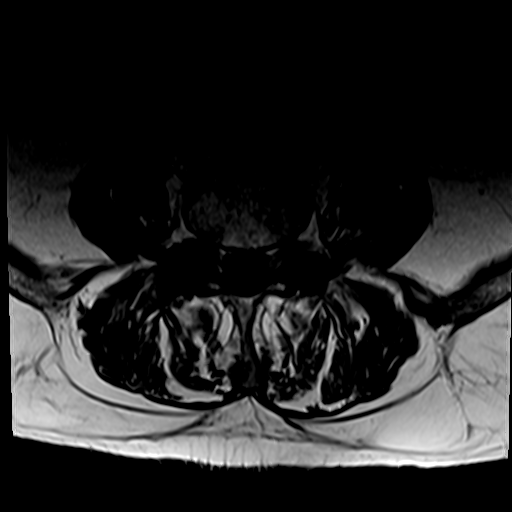
[im 17/36]
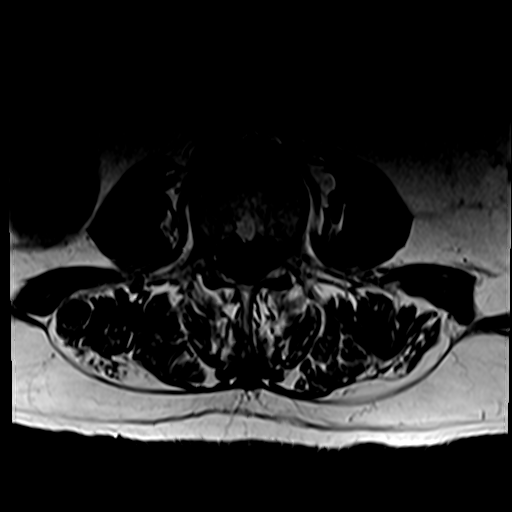
[im 19/36]
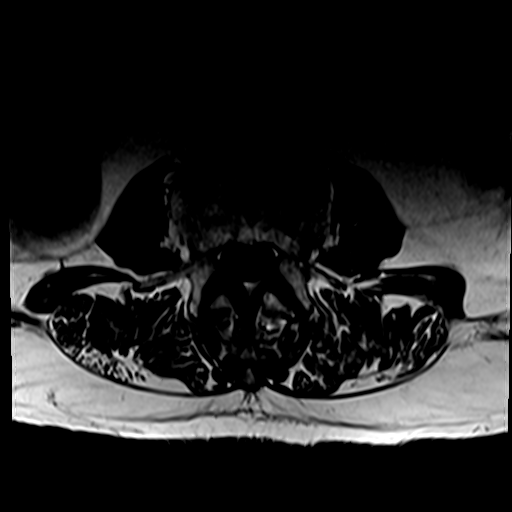
[im 25/36]
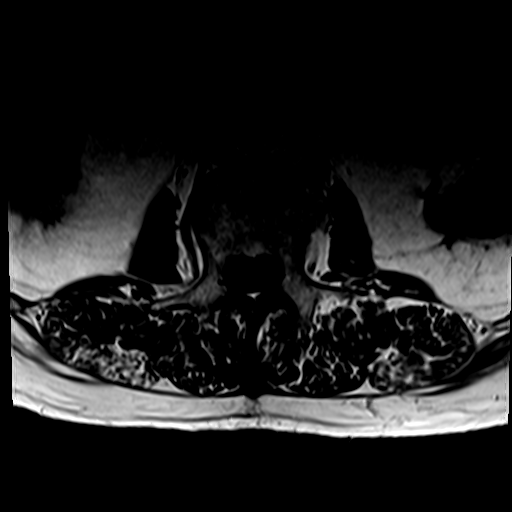
[im 30/36]
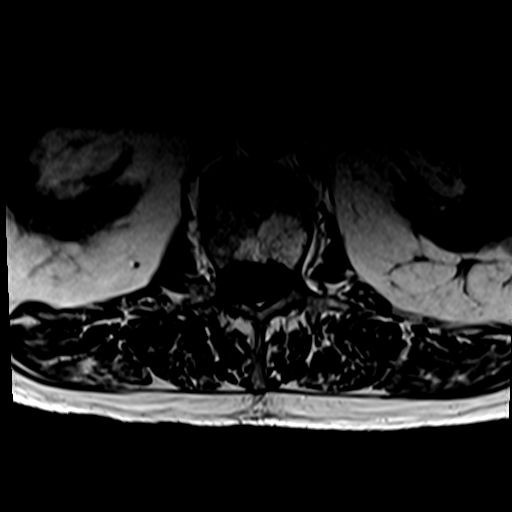
[im 36/36]
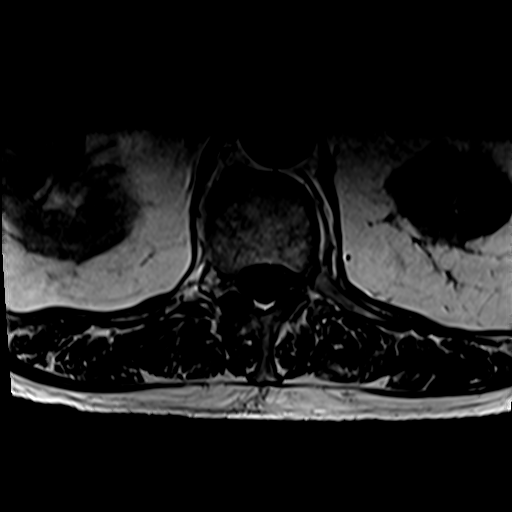

[31 of 48 positions shown; findings below may reference images not displayed]

FINDINGS: Segmentation:  Standard

Alignment:  Grade 1 retrolisthesis at L3-4

Vertebrae:  Worsened discogenic edema at L5-S1

Conus medullaris and cauda equina: Conus extends to the L1 level.
Conus and cauda equina appear normal.

Paraspinal and other soft tissues: Negative

Disc levels:

T12-L1: Disc space narrowing with small bulge, unchanged. No
stenosis.

L1-L2: Disc space narrowing with slight bulge, unchanged. No spinal
canal stenosis. No neural foraminal stenosis.

L2-L3: Unchanged mild disc bulge. No spinal canal stenosis.
Unchanged moderate left neural foraminal stenosis.

L3-L4: Intermediate sized disc bulge with moderate facet
hypertrophy, unchanged. No spinal canal stenosis. Unchanged severe
bilateral neural foraminal stenosis.

L4-L5: Moderate facet hypertrophy with intermediate sized disc bulge
and endplate spurring, unchanged. No spinal canal stenosis.
Unchanged severe right and moderate left neural foraminal stenosis.

L5-S1: Severe facet hypertrophy with unchanged large central disc
extrusion with superior migration to the infrapedicle level.
Synovial cyst arising from the posterior aspect of the left facet
joint measures 4 x 5 mm. No spinal canal stenosis. Worsened severe
bilateral neural foraminal stenosis.

Visualized sacrum: Normal.
IMPRESSION: 1. Worsening of severe bilateral L5-S1 neural foraminal stenosis due
to combination of disc bulge and facet arthrosis. Discogenic edema
is also progressed at this level.
2. Unchanged severe bilateral L3-4 and severe right L4-5 neural
foraminal stenosis.
3. No spinal canal stenosis.

## 2022-11-18 ENCOUNTER — Encounter: Payer: Self-pay | Admitting: Oncology

## 2022-11-18 MED ORDER — SILDENAFIL CITRATE 25 MG PO TABS: 25.0000 mg | ORAL_TABLET | Freq: Every day | ORAL | 0 refills | Status: AC | PRN

## 2023-02-28 ENCOUNTER — Encounter: Payer: Self-pay | Admitting: Oncology

## 2023-03-01 ENCOUNTER — Other Ambulatory Visit: Payer: Self-pay | Admitting: *Deleted

## 2023-03-01 MED ORDER — TAMOXIFEN CITRATE 20 MG PO TABS: 20.0000 mg | ORAL_TABLET | Freq: Every day | ORAL | 3 refills | Status: DC

## 2023-03-31 ENCOUNTER — Encounter: Payer: Self-pay | Admitting: Radiation Oncology

## 2023-03-31 ENCOUNTER — Ambulatory Visit
Admission: RE | Admit: 2023-03-31 | Discharge: 2023-03-31 | Disposition: A | Discharge status: Home / Self Care | Payer: Medicare Other | Source: Ambulatory Visit | Attending: Radiation Oncology | Admitting: Radiation Oncology

## 2023-03-31 VITALS — BP 114/84 | HR 72 | Temp 95.6°F | Resp 16 | Ht 67.0 in | Wt 184.0 lb

## 2023-03-31 DIAGNOSIS — C50022 Malignant neoplasm of nipple and areola, left male breast: Secondary | ICD-10-CM | POA: Diagnosis present

## 2023-03-31 DIAGNOSIS — Z923 Personal history of irradiation: Secondary | ICD-10-CM | POA: Diagnosis not present

## 2023-03-31 DIAGNOSIS — Z170 Estrogen receptor positive status [ER+]: Secondary | ICD-10-CM | POA: Insufficient documentation

## 2023-03-31 DIAGNOSIS — C50922 Malignant neoplasm of unspecified site of left male breast: Secondary | ICD-10-CM

## 2023-03-31 DIAGNOSIS — Z79810 Long term (current) use of selective estrogen receptor modulators (SERMs): Secondary | ICD-10-CM | POA: Diagnosis not present

## 2023-03-31 NOTE — Progress Notes (Signed)
Radiation Oncology Follow up Note  Name: Gerald Hunter   Date:   03/31/2023 MRN:  [id number] DOB: 04/29/1934    This 89 y.o. male presents to the clinic today for 25-month follow-up status post left chest wall radiation therapy for stage IIa ER positive invasive mammary carcinoma of the left male breast.  REFERRING PROVIDER: Lauro Regulus, MD  HPI: Patient is an 89 year old male now out 7 months having completed radiation therapy to his left chest wall for stage IIa ER positive invasive mammary carcinoma of the left middle breast..  Seen today in routine follow-up he is doing well he complains of some burning pain on his left back although there is no skin reaction and it was an area who is that received no radiation.  He otherwise is without complaint.  He is currently on tamoxifen tolerating that well without side effect.  He has not had any imaging studies  COMPLICATIONS OF TREATMENT: none  FOLLOW UP COMPLIANCE: keeps appointments   PHYSICAL EXAM:  BP 114/84   Pulse 72   Temp (!) 95.6 F (35.3 C)   Resp 16   Ht 5\' 7"  (1.702 m)   Wt 184 lb (83.5 kg)   BMI 28.82 kg/m  Patient is status post left mastectomy.  Chest wall is well-healed without evidence of mass or nodularity.  Right breast is free of dominant mass.  No axillary or supraclavicular adenopathy is appreciated.  Well-developed well-nourished patient in NAD. HEENT reveals PERLA, EOMI, discs not visualized.  Oral cavity is clear. No oral mucosal lesions are identified. Neck is clear without evidence of cervical or supraclavicular adenopathy. Lungs are clear to A&P. Cardiac examination is essentially unremarkable with regular rate and rhythm without murmur rub or thrill. Abdomen is benign with no organomegaly or masses noted. Motor sensory and DTR levels are equal and symmetric in the upper and lower extremities. Cranial nerves II through XII are grossly intact. Proprioception is intact. No peripheral adenopathy or edema is  identified. No motor or sensory levels are noted. Crude visual fields are within normal range.  RADIOLOGY RESULTS: No current films for review  PLAN: Present time patient is doing well with no evidence of disease now at 7 months from chest wall radiation.  Of asked to see him back in 1 year for follow-up.  Not sure to recommend what his pain is associated with in his posterior back skin is absolutely normal in that area.  Area also received no radiation exposure.  Patient knows to call with any concerns.  I would like to take this opportunity to thank you for allowing me to participate in the care of your patient.Carmina Miller, MD

## 2023-04-08 ENCOUNTER — Encounter: Payer: Self-pay | Admitting: Oncology

## 2023-04-08 ENCOUNTER — Inpatient Hospital Stay: Payer: Medicare Other | Attending: Oncology | Admitting: Oncology

## 2023-04-08 VITALS — BP 134/82 | HR 69 | Temp 96.1°F | Resp 17 | Wt 182.0 lb

## 2023-04-08 DIAGNOSIS — Z87891 Personal history of nicotine dependence: Secondary | ICD-10-CM | POA: Diagnosis not present

## 2023-04-08 DIAGNOSIS — C50922 Malignant neoplasm of unspecified site of left male breast: Secondary | ICD-10-CM | POA: Diagnosis present

## 2023-04-08 DIAGNOSIS — Z170 Estrogen receptor positive status [ER+]: Secondary | ICD-10-CM | POA: Diagnosis not present

## 2023-04-08 DIAGNOSIS — Z79811 Long term (current) use of aromatase inhibitors: Secondary | ICD-10-CM | POA: Insufficient documentation

## 2023-04-08 DIAGNOSIS — Z923 Personal history of irradiation: Secondary | ICD-10-CM | POA: Insufficient documentation

## 2023-04-08 NOTE — Progress Notes (Signed)
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  INTERVAL HISTORY: Patient returns to clinic today for routine 79-month evaluation.  He currently feels well and is asymptomatic.  He is tolerating tamoxifen without significant side effects. He has no neurologic complaints.  He denies any recent fevers or illnesses.  He has a good appetite and denies weight loss.  He has no chest pain, shortness of breath, cough, or hemoptysis.  He denies any nausea, vomiting, constipation, or diarrhea.  He has no urinary complaints.  Patient offers no specific complaints today.  REVIEW OF SYSTEMS:   Review of Systems  Constitutional: Negative.  Negative for fever, malaise/fatigue and weight loss.  Respiratory: Negative.  Negative for cough, hemoptysis and shortness of breath.   Cardiovascular: Negative.  Negative for chest pain and leg swelling.  Gastrointestinal: Negative.  Negative for abdominal pain.  Genitourinary: Negative.  Negative for dysuria.  Musculoskeletal: Negative.  Negative for back pain.  Skin: Negative.  Negative for rash.  Neurological: Negative.  Negative for dizziness, focal weakness, weakness and headaches.  Psychiatric/Behavioral: Negative.  The patient is not nervous/anxious.     As per HPI. Otherwise, a complete review of systems is negative.  PAST MEDICAL HISTORY: Past Medical History:  Diagnosis Date   A-fib (HCC)    Arthritis    Cataract cortical, senile    Chronic kidney disease    Colon polyps    Foraminal stenosis of lumbar region    Hypercholesteremia    Neuropathy    BOTH FEET   Sciatic pain, right    Tubular adenoma of colon     PAST SURGICAL HISTORY: Past Surgical History:  Procedure  Laterality Date   BREAST BIOPSY Left 06/02/2022   [place] Core Bx, Ribbon clip- path pending   COLONOSCOPY WITH PROPOFOL N/A 02/27/2016   Procedure: COLONOSCOPY WITH PROPOFOL;  Surgeon: Elnita Maxwell, MD;  Location: [facility] ENDOSCOPY;  Service: Endoscopy;  Laterality: N/A;   SIMPLE MASTECTOMY WITH AXILLARY SENTINEL NODE BIOPSY Left 06/21/2022   Procedure: SIMPLE MASTECTOMY WITH AXILLARY SENTINEL NODE BIOPSY;  Surgeon: Carolan Shiver, MD;  Location: [facility] ORS;  Service: General;  Laterality: Left;   UPPER GASTROINTESTINAL ENDOSCOPY  2017    FAMILY HISTORY: Family History  Problem Relation Age of Onset   Pancreatic cancer Maternal Aunt    Esophageal cancer Son 20   Cancer Son        primary not determined, d. 26   Cancer Niece        unk type   Cancer Nephew        unk type    ADVANCED DIRECTIVES (Y/N):  N  HEALTH MAINTENANCE: Social History   Tobacco Use   Smoking status: Former    Types: Cigarettes    Quit date: 1960    Years since quitting: 64.4   Smokeless tobacco: Never  Vaping Use   Vaping Use: Never used  Substance Use Topics   Alcohol use: Not Currently    Alcohol/week: 7.0 standard drinks of alcohol    Types: 7 Standard drinks or equivalent per week    Comment: 7 drinks per week in week   Drug use: Never     Colonoscopy:  PAP:  Bone density:  Lipid panel:  No Known Allergies  Current Outpatient Medications  Medication Sig Dispense Refill   Alpha Lipoic Acid 200 MG CAPS Take 1 capsule by mouth daily.     aspirin 325 MG tablet Take 325 mg by mouth every 6 (six) hours as needed.     atorvastatin (LIPITOR) 10 MG tablet Take 1 tablet by mouth daily.     Cholecalciferol (VITAMIN D-1000 MAX ST) 25 MCG (1000 UT) tablet Take by mouth.     cyanocobalamin (VITAMIN B12) 1000 MCG tablet Take by mouth.     ipratropium (ATROVENT) 0.03 % nasal spray Place into both nostrils.     pyridoxine (B-6) 100 MG tablet Take 100 mg by mouth daily.     sildenafil (VIAGRA) 25  MG tablet Take 1 tablet (25 mg total) by mouth daily as needed for erectile dysfunction. 10 tablet 0   tamoxifen (NOLVADEX) 20 MG tablet Take 1 tablet (20 mg total) by mouth daily. 90 tablet 3   traMADol (ULTRAM) 50 MG tablet Take by mouth.     neomycin-polymyxin b-dexamethasone (MAXITROL) 3.5-10000-0.1 OINT APPLY A SMALL AMOUNT ON THE EYELID TWICE DAILY (Patient not taking: Reported on 10/07/2022)     pregabalin (LYRICA) 50 MG capsule Take 50 mg by mouth 2 (two) times daily.     No current facility-administered medications for this visit.    OBJECTIVE: Vitals:   04/08/23 1031  BP: 134/82  Pulse: 69  Resp: 17  Temp: (!) 96.1 F (35.6 C)  SpO2: 100%     Body mass index is 28.51 kg/m.    ECOG FS:0 - Asymptomatic  General: Well-developed, well-nourished, no acute distress. Eyes: Pink conjunctiva, anicteric sclera. HEENT: Normocephalic, moist mucous membranes. Breast: Well-healed left mastectomy scar without evidence of recurrence. Lungs: No audible wheezing or coughing. Heart: Regular rate and rhythm. Abdomen: Soft, nontender, no obvious distention. Musculoskeletal: No edema, cyanosis, or clubbing. Neuro: Alert, answering all questions appropriately. Cranial nerves grossly intact. Skin: No rashes or petechiae noted. Psych: Normal affect.    LAB RESULTS:  Lab Results  Component Value Date   NA 138 06/16/2022   K 3.6 06/16/2022   CL 107 06/16/2022   CO2 22 06/16/2022   GLUCOSE 132 (H) 06/16/2022   BUN 17 06/16/2022   CREATININE 1.19 06/16/2022   CALCIUM 8.9 06/16/2022   PROT 7.2 12/13/2018   ALBUMIN 4.5 12/13/2018   AST 66 (H) 12/13/2018   ALT 39 12/13/2018   ALKPHOS 68 12/13/2018   BILITOT 1.3 (H) 12/13/2018   GFRNONAA 60 (L) 06/16/2022   GFRAA 59 (L) 12/13/2018    Lab Results  Component Value Date   WBC 7.2 08/18/2022   NEUTROABS 4.5 12/13/2018   HGB 16.4 08/18/2022   HCT 48.5 08/18/2022   MCV 93.1 08/18/2022   PLT 200 08/18/2022     STUDIES: No  results found.  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  He underwent simple mastectomy on June 21, 2022.  Although he is a stage II, his Oncotype score was considered low risk therefore chemotherapy was not necessary.  Given his advanced age, patient was also hesitant to pursue systemic treatment.  He completed XRT in November 2023.  Recommendation is to take tamoxifen for a total of 5 years completing treatment in November 2028.  No intervention is needed  at this time.  Return to clinic in 6 months with video-assisted telemedicine visit.    Genetics: No pathogenic variants identified.  Appreciate genetics input.  I spent a total of 20 minutes reviewing chart data, face-to-face evaluation with the patient, counseling and coordination of care as detailed above.    Patient expressed understanding and was in agreement with this plan. He also understands that He can call clinic at any time with any questions, concerns, or complaints.    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

## 2023-10-10 ENCOUNTER — Inpatient Hospital Stay: Payer: Medicare Other | Attending: Oncology | Admitting: Oncology

## 2023-10-10 DIAGNOSIS — Z800 Family history of malignant neoplasm of digestive organs: Secondary | ICD-10-CM

## 2023-10-10 DIAGNOSIS — Z87891 Personal history of nicotine dependence: Secondary | ICD-10-CM | POA: Diagnosis not present

## 2023-10-10 DIAGNOSIS — Z79810 Long term (current) use of selective estrogen receptor modulators (SERMs): Secondary | ICD-10-CM

## 2023-10-10 DIAGNOSIS — C50922 Malignant neoplasm of unspecified site of left male breast: Secondary | ICD-10-CM

## 2023-10-10 DIAGNOSIS — Z170 Estrogen receptor positive status [ER+]: Secondary | ICD-10-CM | POA: Diagnosis not present

## 2023-10-10 NOTE — Progress Notes (Signed)
Gerald Hunter  Telephone:(336) [phone] Fax:(336) [phone]  ID: Gerald Hunter OB: 1934/10/23  MR#: [id number]  KGU#:[id number]  Patient Care Team: Lauro Regulus, MD as PCP - General (Internal Medicine)  I connected with Wynona Canes on 10/10/23 at  3:30 PM EST by video enabled telemedicine visit and verified that I am speaking with the correct person using two identifiers.   I discussed the limitations, risks, security and privacy concerns of performing an evaluation and management service by telemedicine and the availability of in-person appointments. I also discussed with the patient that there may be a patient responsible charge related to this service. The patient expressed understanding and agreed to proceed.   Other persons participating in the visit and their role in the encounter: Patient, MD.  Patient's location: Home. Provider's location: Clinic.  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  INTERVAL HISTORY: Patient agreed to video-assisted telemedicine visit for routine 59-month evaluation.  He continues to feel well and remains asymptomatic.  He continues to tolerate tamoxifen without significant side effects.  He reports self chest wall and breast exams are normal.  He has no neurologic complaints.  He denies any recent fevers or illnesses.  He has a good appetite and denies weight loss.  He has no chest pain, shortness of breath, cough, or hemoptysis.  He denies any nausea, vomiting, constipation, or diarrhea.  He has no urinary complaints.  Patient offers no specific complaints today.  REVIEW OF SYSTEMS:   Review of Systems  Constitutional: Negative.  Negative for fever, malaise/fatigue and weight loss.  Respiratory: Negative.  Negative for cough, hemoptysis and shortness of breath.   Cardiovascular: Negative.  Negative for chest pain and leg swelling.  Gastrointestinal: Negative.  Negative for  abdominal pain.  Genitourinary: Negative.  Negative for dysuria.  Musculoskeletal: Negative.  Negative for back pain.  Skin: Negative.  Negative for rash.  Neurological: Negative.  Negative for dizziness, focal weakness, weakness and headaches.  Psychiatric/Behavioral: Negative.  The patient is not nervous/anxious.     As per HPI. Otherwise, a complete review of systems is negative.  PAST MEDICAL HISTORY: Past Medical History:  Diagnosis Date   A-fib (HCC)    Arthritis    Cataract cortical, senile    Chronic kidney disease    Colon polyps    Foraminal stenosis of lumbar region    Hypercholesteremia    Neuropathy    BOTH FEET   Sciatic pain, right    Tubular adenoma of colon     PAST SURGICAL HISTORY: Past Surgical History:  Procedure Laterality Date   BREAST BIOPSY Left 06/02/2022   [place] Core Bx, Ribbon clip- path pending   COLONOSCOPY WITH PROPOFOL N/A 02/27/2016   Procedure: COLONOSCOPY WITH PROPOFOL;  Surgeon: Elnita Maxwell, MD;  Location: [facility] ENDOSCOPY;  Service: Endoscopy;  Laterality: N/A;   SIMPLE MASTECTOMY WITH AXILLARY SENTINEL NODE BIOPSY Left 06/21/2022   Procedure: SIMPLE MASTECTOMY WITH AXILLARY SENTINEL NODE BIOPSY;  Surgeon: Carolan Shiver, MD;  Location: [facility] ORS;  Service: General;  Laterality: Left;   UPPER GASTROINTESTINAL ENDOSCOPY  2017    FAMILY HISTORY: Family History  Problem Relation Age of Onset   Pancreatic cancer Maternal Aunt    Esophageal cancer Son 16   Cancer Son        primary not determined, d. 39   Cancer Niece        unk type   Cancer Nephew  unk type    ADVANCED DIRECTIVES (Y/N):  N  HEALTH MAINTENANCE: Social History   Tobacco Use   Smoking status: Former    Current packs/day: 0.00    Types: Cigarettes    Quit date: 1960    Years since quitting: 64.9   Smokeless tobacco: Never  Vaping Use   Vaping status: Never Used  Substance Use Topics   Alcohol use: Not Currently    Alcohol/week: 7.0 standard  drinks of alcohol    Types: 7 Standard drinks or equivalent per week    Comment: 7 drinks per week in week   Drug use: Never     Colonoscopy:  PAP:  Bone density:  Lipid panel:  No Known Allergies  Current Outpatient Medications  Medication Sig Dispense Refill   Alpha Lipoic Acid 200 MG CAPS Take 1 capsule by mouth daily.     aspirin 325 MG tablet Take 325 mg by mouth every 6 (six) hours as needed.     atorvastatin (LIPITOR) 10 MG tablet Take 1 tablet by mouth daily.     Cholecalciferol (VITAMIN D-1000 MAX ST) 25 MCG (1000 UT) tablet Take by mouth.     cyanocobalamin (VITAMIN B12) 1000 MCG tablet Take by mouth.     ipratropium (ATROVENT) 0.03 % nasal spray Place into both nostrils.     neomycin-polymyxin b-dexamethasone (MAXITROL) 3.5-10000-0.1 OINT APPLY A SMALL AMOUNT ON THE EYELID TWICE DAILY (Patient not taking: Reported on 10/07/2022)     pregabalin (LYRICA) 50 MG capsule Take 50 mg by mouth 2 (two) times daily.     pyridoxine (B-6) 100 MG tablet Take 100 mg by mouth daily.     sildenafil (VIAGRA) 25 MG tablet Take 1 tablet (25 mg total) by mouth daily as needed for erectile dysfunction. 10 tablet 0   tamoxifen (NOLVADEX) 20 MG tablet Take 1 tablet (20 mg total) by mouth daily. 90 tablet 3   traMADol (ULTRAM) 50 MG tablet Take by mouth.     No current facility-administered medications for this visit.    OBJECTIVE: There were no vitals filed for this visit.    There is no height or weight on file to calculate BMI.    ECOG FS:0 - Asymptomatic  General: Well-developed, well-nourished, no acute distress. HEENT: Normocephalic. Neuro: Alert, answering all questions appropriately. Cranial nerves grossly intact. Psych: Normal affect.  LAB RESULTS:  Lab Results  Component Value Date   NA 138 06/16/2022   K 3.6 06/16/2022   CL 107 06/16/2022   CO2 22 06/16/2022   GLUCOSE 132 (H) 06/16/2022   BUN 17 06/16/2022   CREATININE 1.19 06/16/2022   CALCIUM 8.9 06/16/2022   PROT  7.2 12/13/2018   ALBUMIN 4.5 12/13/2018   AST 66 (H) 12/13/2018   ALT 39 12/13/2018   ALKPHOS 68 12/13/2018   BILITOT 1.3 (H) 12/13/2018   GFRNONAA 60 (L) 06/16/2022   GFRAA 59 (L) 12/13/2018    Lab Results  Component Value Date   WBC 7.2 08/18/2022   NEUTROABS 4.5 12/13/2018   HGB 16.4 08/18/2022   HCT 48.5 08/18/2022   MCV 93.1 08/18/2022   PLT 200 08/18/2022     STUDIES: No results found.  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  He underwent simple mastectomy  on June 21, 2022.  Although he is a stage II, his Oncotype score was considered low risk therefore chemotherapy was not necessary.  Given his advanced age, patient was also hesitant to pursue systemic treatment.  He completed XRT in November 2023.  Recommendation is to take tamoxifen for a total of 5 years completing treatment in November 2028.  No intervention is needed at this time.  Return to clinic in 6 months with video-assisted telemedicine visit.    Genetics: No pathogenic variants identified.  Appreciate genetics input.  I provided 20 minutes of face-to-face video visit time during this encounter which included chart review, counseling, and coordination of care as documented above.     Patient expressed understanding and was in agreement with this plan. He also understands that He can call clinic at any time with any questions, concerns, or complaints.    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

## 2024-02-28 ENCOUNTER — Other Ambulatory Visit: Payer: Self-pay | Admitting: *Deleted

## 2024-02-28 ENCOUNTER — Telehealth: Payer: Self-pay | Admitting: *Deleted

## 2024-02-28 MED ORDER — TAMOXIFEN CITRATE 20 MG PO TABS: 20.0000 mg | ORAL_TABLET | Freq: Every day | ORAL | 3 refills | Status: AC

## 2024-02-28 NOTE — Telephone Encounter (Signed)
 I called the patient and let him know I sent the refill  and MD will sign the tamoxifen . He understands

## 2024-04-04 ENCOUNTER — Ambulatory Visit: Payer: Medicare Other | Admitting: Radiation Oncology

## 2024-04-09 ENCOUNTER — Inpatient Hospital Stay: Payer: Medicare Other | Attending: Oncology | Admitting: Oncology

## 2024-04-09 DIAGNOSIS — Z87891 Personal history of nicotine dependence: Secondary | ICD-10-CM

## 2024-04-09 DIAGNOSIS — Z9012 Acquired absence of left breast and nipple: Secondary | ICD-10-CM

## 2024-04-09 DIAGNOSIS — Z1721 Progesterone receptor positive status: Secondary | ICD-10-CM

## 2024-04-09 DIAGNOSIS — Z170 Estrogen receptor positive status [ER+]: Secondary | ICD-10-CM

## 2024-04-09 DIAGNOSIS — Z800 Family history of malignant neoplasm of digestive organs: Secondary | ICD-10-CM

## 2024-04-09 DIAGNOSIS — Z923 Personal history of irradiation: Secondary | ICD-10-CM

## 2024-04-09 DIAGNOSIS — C50922 Malignant neoplasm of unspecified site of left male breast: Secondary | ICD-10-CM | POA: Diagnosis not present

## 2024-04-09 NOTE — Progress Notes (Unsigned)
 [facility]  Telephone:(336) [phone] Fax:(336) [phone]  ID: Gerald Hunter OB: February 23, 1934  MR#: [id number]  WUX#:[id number]  Patient Care Team: Jimmy Moulding, MD as PCP - General (Internal Medicine)  I connected with Gerald Hunter on 04/10/24 at  2:45 PM EDT by video enabled telemedicine visit and verified that I am speaking with the correct person using two identifiers.   I discussed the limitations, risks, security and privacy concerns of performing an evaluation and management service by telemedicine and the availability of in-person appointments. I also discussed with the patient that there may be a patient responsible charge related to this service. The patient expressed understanding and agreed to proceed.   Other persons participating in the visit and their role in the encounter: Patient, MD.  Patient's location: Home. Provider's location: Clinic.  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  INTERVAL HISTORY: Patient agreed to video-assisted telemedicine for routine 4-month evaluation.  He currently feels well and is asymptomatic.  He reports he is unable to have an orgasm since initiating tamoxifen .  He also has persistent chest wall tenderness that is unchanged since his surgery.  He has no neurologic complaints.  He denies any recent fevers or illnesses.  He has a good appetite and denies weight loss.  He has no chest pain, shortness of breath, cough, or hemoptysis.  He denies any nausea, vomiting, constipation, or diarrhea.  He has no urinary complaints.  Patient offers no further specific complaints today.  REVIEW OF SYSTEMS:   Review of Systems  Constitutional: Negative.  Negative for fever, malaise/fatigue and weight loss.  Respiratory: Negative.  Negative for cough, hemoptysis and shortness of breath.   Cardiovascular: Negative.  Negative for chest pain and leg swelling.  Gastrointestinal: Negative.   Negative for abdominal pain.  Genitourinary: Negative.  Negative for dysuria.  Musculoskeletal: Negative.  Negative for back pain.  Skin: Negative.  Negative for rash.  Neurological: Negative.  Negative for dizziness, focal weakness, weakness and headaches.  Psychiatric/Behavioral: Negative.  The patient is not nervous/anxious.     As per HPI. Otherwise, a complete review of systems is negative.  PAST MEDICAL HISTORY: Past Medical History:  Diagnosis Date   A-fib (HCC)    Arthritis    Cataract cortical, senile    Chronic kidney disease    Colon polyps    Foraminal stenosis of lumbar region    Hypercholesteremia    Neuropathy    BOTH FEET   Sciatic pain, right    Tubular adenoma of colon     PAST SURGICAL HISTORY: Past Surgical History:  Procedure Laterality Date   BREAST BIOPSY Left 06/02/2022   US  Core Bx, Ribbon clip- path pending   COLONOSCOPY WITH PROPOFOL  N/A 02/27/2016   Procedure: COLONOSCOPY WITH PROPOFOL ;  Surgeon: Luella Sager, MD;  Location: [facility] ENDOSCOPY;  Service: Endoscopy;  Laterality: N/A;   SIMPLE MASTECTOMY WITH AXILLARY SENTINEL NODE BIOPSY Left 06/21/2022   Procedure: SIMPLE MASTECTOMY WITH AXILLARY SENTINEL NODE BIOPSY;  Surgeon: Eldred Grego, MD;  Location: [facility] ORS;  Service: General;  Laterality: Left;   UPPER GASTROINTESTINAL ENDOSCOPY  2017    FAMILY HISTORY: Family History  Problem Relation Age of Onset   Pancreatic cancer Maternal Aunt    Esophageal cancer Son 45   Cancer Son        primary not determined, d. 79   Cancer Niece        unk type   Cancer Nephew  unk type    ADVANCED DIRECTIVES (Y/N):  N  HEALTH MAINTENANCE: Social History   Tobacco Use   Smoking status: Former    Current packs/day: 0.00    Types: Cigarettes    Quit date: 1960    Years since quitting: 65.4   Smokeless tobacco: Never  Vaping Use   Vaping status: Never Used  Substance Use Topics   Alcohol use: Not Currently     Alcohol/week: 7.0 standard drinks of alcohol    Types: 7 Standard drinks or equivalent per week    Comment: 7 drinks per week in week   Drug use: Never     Colonoscopy:  PAP:  Bone density:  Lipid panel:  No Known Allergies  Current Outpatient Medications  Medication Sig Dispense Refill   Alpha Lipoic Acid 200 MG CAPS Take 1 capsule by mouth daily.     aspirin 325 MG tablet Take 325 mg by mouth every 6 (six) hours as needed.     atorvastatin (LIPITOR) 10 MG tablet Take 1 tablet by mouth daily.     Cholecalciferol (VITAMIN D-1000 MAX ST) 25 MCG (1000 UT) tablet Take by mouth.     cyanocobalamin (VITAMIN B12) 1000 MCG tablet Take by mouth.     ipratropium (ATROVENT) 0.03 % nasal spray Place into both nostrils.     neomycin-polymyxin b-dexamethasone  (MAXITROL) 3.5-10000-0.1 OINT APPLY A SMALL AMOUNT ON THE EYELID TWICE DAILY (Patient not taking: Reported on 10/07/2022)     pregabalin (LYRICA) 50 MG capsule Take 50 mg by mouth 2 (two) times daily.     pyridoxine (B-6) 100 MG tablet Take 100 mg by mouth daily.     sildenafil  (VIAGRA ) 25 MG tablet Take 1 tablet (25 mg total) by mouth daily as needed for erectile dysfunction. 10 tablet 0   tamoxifen  (NOLVADEX ) 20 MG tablet Take 1 tablet (20 mg total) by mouth daily. 90 tablet 3   traMADol (ULTRAM) 50 MG tablet Take by mouth.     No current facility-administered medications for this visit.    OBJECTIVE: There were no vitals filed for this visit.    There is no height or weight on file to calculate BMI.    ECOG FS:0 - Asymptomatic  General: Well-developed, well-nourished, no acute distress. HEENT: Normocephalic. Neuro: Alert, answering all questions appropriately. Cranial nerves grossly intact. Psych: Normal affect.  LAB RESULTS:  Lab Results  Component Value Date   NA 138 06/16/2022   K 3.6 06/16/2022   CL 107 06/16/2022   CO2 22 06/16/2022   GLUCOSE 132 (H) 06/16/2022   BUN 17 06/16/2022   CREATININE 1.19 06/16/2022    CALCIUM 8.9 06/16/2022   PROT 7.2 12/13/2018   ALBUMIN 4.5 12/13/2018   AST 66 (H) 12/13/2018   ALT 39 12/13/2018   ALKPHOS 68 12/13/2018   BILITOT 1.3 (H) 12/13/2018   GFRNONAA 60 (L) 06/16/2022   GFRAA 59 (L) 12/13/2018    Lab Results  Component Value Date   WBC 7.2 08/18/2022   NEUTROABS 4.5 12/13/2018   HGB 16.4 08/18/2022   HCT 48.5 08/18/2022   MCV 93.1 08/18/2022   PLT 200 08/18/2022     STUDIES: No results found.  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  He underwent simple mastectomy  on June 21, 2022.  Although he is a stage II, his Oncotype score was considered low risk therefore chemotherapy was not necessary.  Given his advanced age, patient was also hesitant to pursue systemic treatment.  He completed XRT in November 2023.  Recommendation is to take tamoxifen  for a total of 5 years completing treatment in November 2028.  Patient wishes to hold tamoxifen  for 3 months to see if his problem with orgasms is related to treatment.  No further intervention is needed.  Return to clinic in 3 months for video-assisted telemedicine visit and discussion on whether or not to reinitiate tamoxifen .    Genetics: No pathogenic variants identified.  Appreciate genetics input.  I provided 20 minutes of face-to-face video visit time during this encounter which included chart review, counseling, and coordination of care as documented above.   Patient expressed understanding and was in agreement with this plan. He also understands that He can call clinic at any time with any questions, concerns, or complaints.    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

## 2024-07-06 ENCOUNTER — Other Ambulatory Visit: Payer: Self-pay | Admitting: Family Medicine

## 2024-07-06 DIAGNOSIS — M5416 Radiculopathy, lumbar region: Secondary | ICD-10-CM

## 2024-07-07 ENCOUNTER — Ambulatory Visit
Admission: RE | Admit: 2024-07-07 | Discharge: 2024-07-07 | Disposition: A | Discharge status: Home / Self Care | Source: Ambulatory Visit | Attending: Family Medicine | Admitting: Family Medicine

## 2024-07-07 DIAGNOSIS — M5416 Radiculopathy, lumbar region: Secondary | ICD-10-CM

## 2024-07-10 ENCOUNTER — Other Ambulatory Visit: Payer: Self-pay | Admitting: Internal Medicine

## 2024-07-10 DIAGNOSIS — R1909 Other intra-abdominal and pelvic swelling, mass and lump: Secondary | ICD-10-CM

## 2024-07-24 ENCOUNTER — Ambulatory Visit
Admission: RE | Admit: 2024-07-24 | Discharge: 2024-07-24 | Disposition: A | Discharge status: Home / Self Care | Source: Ambulatory Visit | Attending: Internal Medicine | Admitting: Internal Medicine

## 2024-07-24 DIAGNOSIS — R1909 Other intra-abdominal and pelvic swelling, mass and lump: Secondary | ICD-10-CM | POA: Diagnosis present

## 2024-07-24 MED ORDER — IOHEXOL 9 MG/ML PO SOLN
500.0000 mL | ORAL | Status: AC
  Administered 2024-07-24 (×2): 500 mL via ORAL
